# Patient Record
Sex: Female | Born: 1965 | Race: White | Hispanic: No | Marital: Married | Smoking: Never smoker
Health system: Southern US, Community
[De-identification: ages and names within clinical notes are randomized; demographics above are authoritative.]

## PROBLEM LIST (undated history)

## (undated) DIAGNOSIS — K219 Gastro-esophageal reflux disease without esophagitis: Secondary | ICD-10-CM

## (undated) HISTORY — PX: KNEE SURGERY: SHX244

## (undated) HISTORY — PX: VASCULAR SURGERY: SHX849

## (undated) HISTORY — DX: Gastro-esophageal reflux disease without esophagitis: K21.9

---

## 1998-08-31 ENCOUNTER — Other Ambulatory Visit: Admission: RE | Admit: 1998-08-31 | Discharge: 1998-08-31 | Payer: Self-pay | Admitting: Obstetrics and Gynecology

## 1999-10-19 ENCOUNTER — Other Ambulatory Visit: Admission: RE | Admit: 1999-10-19 | Discharge: 1999-10-19 | Payer: Self-pay | Admitting: Obstetrics and Gynecology

## 2001-03-05 ENCOUNTER — Ambulatory Visit (HOSPITAL_COMMUNITY): Admission: RE | Admit: 2001-03-05 | Discharge: 2001-03-05 | Payer: Self-pay | Admitting: Orthopedic Surgery

## 2001-03-05 ENCOUNTER — Encounter: Payer: Self-pay | Admitting: Orthopedic Surgery

## 2001-12-05 ENCOUNTER — Other Ambulatory Visit: Admission: RE | Admit: 2001-12-05 | Discharge: 2001-12-05 | Payer: Self-pay | Admitting: Obstetrics and Gynecology

## 2001-12-06 ENCOUNTER — Other Ambulatory Visit: Admission: RE | Admit: 2001-12-06 | Discharge: 2001-12-06 | Payer: Self-pay | Admitting: Dermatology

## 2001-12-18 ENCOUNTER — Encounter: Payer: Self-pay | Admitting: Emergency Medicine

## 2001-12-18 ENCOUNTER — Emergency Department (HOSPITAL_COMMUNITY): Admission: EM | Admit: 2001-12-18 | Discharge: 2001-12-18 | Payer: Self-pay | Admitting: Emergency Medicine

## 2003-01-27 ENCOUNTER — Other Ambulatory Visit: Admission: RE | Admit: 2003-01-27 | Discharge: 2003-01-27 | Payer: Self-pay | Admitting: Obstetrics & Gynecology

## 2004-04-16 ENCOUNTER — Encounter: Admission: RE | Admit: 2004-04-16 | Discharge: 2004-04-16 | Payer: Self-pay | Admitting: Obstetrics and Gynecology

## 2004-06-03 ENCOUNTER — Ambulatory Visit (HOSPITAL_COMMUNITY): Admission: RE | Admit: 2004-06-03 | Discharge: 2004-06-03 | Payer: Self-pay | Admitting: Internal Medicine

## 2005-03-14 ENCOUNTER — Ambulatory Visit (HOSPITAL_COMMUNITY): Admission: RE | Admit: 2005-03-14 | Discharge: 2005-03-14 | Payer: Self-pay | Admitting: Internal Medicine

## 2005-03-29 ENCOUNTER — Ambulatory Visit (HOSPITAL_COMMUNITY): Admission: RE | Admit: 2005-03-29 | Discharge: 2005-03-29 | Payer: Self-pay | Admitting: Internal Medicine

## 2008-01-23 ENCOUNTER — Encounter: Admission: RE | Admit: 2008-01-23 | Discharge: 2008-01-23 | Payer: Self-pay | Admitting: General Surgery

## 2008-08-18 ENCOUNTER — Encounter: Admission: RE | Admit: 2008-08-18 | Discharge: 2008-08-18 | Payer: Self-pay | Admitting: General Surgery

## 2008-09-25 ENCOUNTER — Ambulatory Visit (HOSPITAL_COMMUNITY): Admission: RE | Admit: 2008-09-25 | Discharge: 2008-09-25 | Payer: Self-pay | Admitting: Internal Medicine

## 2009-01-20 ENCOUNTER — Encounter: Admission: RE | Admit: 2009-01-20 | Discharge: 2009-01-20 | Payer: Self-pay | Admitting: General Surgery

## 2009-10-15 ENCOUNTER — Ambulatory Visit (HOSPITAL_BASED_OUTPATIENT_CLINIC_OR_DEPARTMENT_OTHER): Admission: RE | Admit: 2009-10-15 | Discharge: 2009-10-15 | Payer: Self-pay | Admitting: Orthopedic Surgery

## 2011-07-18 ENCOUNTER — Inpatient Hospital Stay (HOSPITAL_COMMUNITY): Payer: BC Managed Care – PPO | Admitting: Anesthesiology

## 2011-07-18 ENCOUNTER — Encounter (HOSPITAL_COMMUNITY)
Admission: EM | Disposition: A | Discharge status: Home / Self Care | Payer: Self-pay | Source: Home / Self Care | Attending: General Surgery

## 2011-07-18 ENCOUNTER — Observation Stay (HOSPITAL_COMMUNITY)
Admission: EM | Admit: 2011-07-18 | Discharge: 2011-07-19 | Disposition: A | Discharge status: Home / Self Care | Payer: BC Managed Care – PPO | Attending: General Surgery | Admitting: General Surgery

## 2011-07-18 ENCOUNTER — Other Ambulatory Visit: Payer: Self-pay | Admitting: General Surgery

## 2011-07-18 ENCOUNTER — Encounter (HOSPITAL_COMMUNITY): Payer: Self-pay | Admitting: Anesthesiology

## 2011-07-18 ENCOUNTER — Emergency Department (HOSPITAL_COMMUNITY): Payer: BC Managed Care – PPO

## 2011-07-18 ENCOUNTER — Encounter (HOSPITAL_COMMUNITY): Payer: Self-pay

## 2011-07-18 DIAGNOSIS — K3580 Unspecified acute appendicitis: Principal | ICD-10-CM | POA: Insufficient documentation

## 2011-07-18 DIAGNOSIS — R1031 Right lower quadrant pain: Secondary | ICD-10-CM | POA: Insufficient documentation

## 2011-07-18 DIAGNOSIS — R110 Nausea: Secondary | ICD-10-CM | POA: Insufficient documentation

## 2011-07-18 DIAGNOSIS — R509 Fever, unspecified: Secondary | ICD-10-CM | POA: Insufficient documentation

## 2011-07-18 HISTORY — PX: LAPAROSCOPIC APPENDECTOMY: SHX408

## 2011-07-18 LAB — URINE MICROSCOPIC-ADD ON

## 2011-07-18 LAB — URINALYSIS, ROUTINE W REFLEX MICROSCOPIC
Leukocytes, UA: NEGATIVE
Nitrite: NEGATIVE
Protein, ur: 30 mg/dL — AB
Specific Gravity, Urine: 1.02 (ref 1.005–1.030)
Urobilinogen, UA: 0.2 mg/dL (ref 0.0–1.0)

## 2011-07-18 LAB — DIFFERENTIAL
Basophils Absolute: 0 10*3/uL (ref 0.0–0.1)
Eosinophils Absolute: 0 10*3/uL (ref 0.0–0.7)
Eosinophils Relative: 0 % (ref 0–5)
Neutrophils Relative %: 82 % — ABNORMAL HIGH (ref 43–77)

## 2011-07-18 LAB — BASIC METABOLIC PANEL
Calcium: 9.9 mg/dL (ref 8.4–10.5)
GFR calc Af Amer: 80 mL/min — ABNORMAL LOW (ref >90)
GFR calc non Af Amer: 69 mL/min — ABNORMAL LOW (ref >90)
Potassium: 3.9 mEq/L (ref 3.5–5.1)
Sodium: 137 mEq/L (ref 135–145)

## 2011-07-18 LAB — LIPASE, BLOOD: Lipase: 17 U/L (ref 11–59)

## 2011-07-18 LAB — CBC
MCH: 29.5 pg (ref 26.0–34.0)
MCV: 89.7 fL (ref 78.0–100.0)
Platelets: 236 10*3/uL (ref 150–400)
RBC: 4.75 MIL/uL (ref 3.87–5.11)
RDW: 12.7 % (ref 11.5–15.5)
WBC: 19.5 10*3/uL — ABNORMAL HIGH (ref 4.0–10.5)

## 2011-07-18 LAB — PREGNANCY, URINE: Preg Test, Ur: NEGATIVE

## 2011-07-18 SURGERY — APPENDECTOMY, LAPAROSCOPIC
Anesthesia: General | Site: Abdomen | Wound class: Contaminated

## 2011-07-18 MED ORDER — PROPOFOL 10 MG/ML IV EMUL
INTRAVENOUS | Status: DC | PRN
  Administered 2011-07-18: 50 mg via INTRAVENOUS
  Administered 2011-07-18: 150 mg via INTRAVENOUS

## 2011-07-18 MED ORDER — HYDROCODONE-ACETAMINOPHEN 5-325 MG PO TABS: 1.0000 | ORAL_TABLET | ORAL | Status: DC | PRN

## 2011-07-18 MED ORDER — SODIUM CHLORIDE 0.9 % IV SOLN
1.0000 g | Freq: Once | INTRAVENOUS | Status: AC
  Administered 2011-07-18: 1 g via INTRAVENOUS
  Filled 2011-07-18: qty 1

## 2011-07-18 MED ORDER — SUCCINYLCHOLINE CHLORIDE 20 MG/ML IJ SOLN
INTRAMUSCULAR | Status: DC | PRN
  Administered 2011-07-18: 100 mg via INTRAVENOUS

## 2011-07-18 MED ORDER — ENOXAPARIN SODIUM 40 MG/0.4ML ~~LOC~~ SOLN: 40.0000 mg | Freq: Once | SUBCUTANEOUS | Status: DC

## 2011-07-18 MED ORDER — LIDOCAINE HCL (CARDIAC) 10 MG/ML IV SOLN
INTRAVENOUS | Status: DC | PRN
  Administered 2011-07-18: 50 mg via INTRAVENOUS

## 2011-07-18 MED ORDER — LACTATED RINGERS IV SOLN
INTRAVENOUS | Status: DC | PRN
  Administered 2011-07-18: 18:00:00 via INTRAVENOUS

## 2011-07-18 MED ORDER — ENOXAPARIN SODIUM 40 MG/0.4ML ~~LOC~~ SOLN
40.0000 mg | SUBCUTANEOUS | Status: DC
  Administered 2011-07-18: 40 mg via SUBCUTANEOUS
  Filled 2011-07-18: qty 0.4

## 2011-07-18 MED ORDER — ROCURONIUM BROMIDE 100 MG/10ML IV SOLN
INTRAVENOUS | Status: DC | PRN
  Administered 2011-07-18: 10 mg via INTRAVENOUS

## 2011-07-18 MED ORDER — FENTANYL CITRATE 0.05 MG/ML IJ SOLN
INTRAMUSCULAR | Status: DC | PRN
  Administered 2011-07-18: 100 ug via INTRAVENOUS
  Administered 2011-07-18 (×4): 50 ug via INTRAVENOUS

## 2011-07-18 MED ORDER — LACTATED RINGERS IV SOLN: INTRAVENOUS | Status: DC

## 2011-07-18 MED ORDER — ACETAMINOPHEN 325 MG PO TABS
650.0000 mg | ORAL_TABLET | Freq: Four times a day (QID) | ORAL | Status: DC | PRN
  Administered 2011-07-19 (×3): 650 mg via ORAL
  Filled 2011-07-18 (×3): qty 2

## 2011-07-18 MED ORDER — HYDROMORPHONE HCL PF 1 MG/ML IJ SOLN
1.0000 mg | INTRAMUSCULAR | Status: DC | PRN
  Administered 2011-07-18: 1 mg via INTRAVENOUS

## 2011-07-18 MED ORDER — ONDANSETRON HCL 4 MG/2ML IJ SOLN: 4.0000 mg | Freq: Four times a day (QID) | INTRAMUSCULAR | Status: DC | PRN

## 2011-07-18 MED ORDER — ONDANSETRON HCL 4 MG/2ML IJ SOLN
INTRAMUSCULAR | Status: DC | PRN
  Administered 2011-07-18: 4 mg via INTRAVENOUS

## 2011-07-18 MED ORDER — GLYCOPYRROLATE 0.2 MG/ML IJ SOLN
INTRAMUSCULAR | Status: DC | PRN
  Administered 2011-07-18: .6 mg via INTRAVENOUS

## 2011-07-18 MED ORDER — IOHEXOL 300 MG/ML  SOLN
100.0000 mL | Freq: Once | INTRAMUSCULAR | Status: AC | PRN
  Administered 2011-07-18: 100 mL via INTRAVENOUS

## 2011-07-18 MED ORDER — MIDAZOLAM HCL 5 MG/5ML IJ SOLN
INTRAMUSCULAR | Status: DC | PRN
  Administered 2011-07-18: 2 mg via INTRAVENOUS

## 2011-07-18 MED ORDER — NEOSTIGMINE METHYLSULFATE 1 MG/ML IJ SOLN
INTRAMUSCULAR | Status: DC | PRN
  Administered 2011-07-18: 4 mg via INTRAVENOUS

## 2011-07-18 MED ORDER — SODIUM CHLORIDE 0.9 % IR SOLN
Status: DC | PRN
  Administered 2011-07-18: 1000 mL

## 2011-07-18 MED ORDER — BUPIVACAINE HCL 0.5 % IJ SOLN
INTRAMUSCULAR | Status: DC | PRN
  Administered 2011-07-18: 10 mL

## 2011-07-18 MED ORDER — SODIUM CHLORIDE 0.9 % IV SOLN
INTRAVENOUS | Status: DC | PRN
  Administered 2011-07-18: 17:00:00 via INTRAVENOUS

## 2011-07-18 MED ORDER — PANTOPRAZOLE SODIUM 40 MG IV SOLR
40.0000 mg | Freq: Every day | INTRAVENOUS | Status: DC
  Administered 2011-07-18: 40 mg via INTRAVENOUS
  Filled 2011-07-18: qty 40

## 2011-07-18 SURGICAL SUPPLY — 46 items
APL SKNCLS STERI-STRIP NONHPOA (GAUZE/BANDAGES/DRESSINGS) ×1
BAG HAMPER (MISCELLANEOUS) ×2 IMPLANT
BAG SPEC RTRVL LRG 6X4 10 (ENDOMECHANICALS) ×1
BENZOIN TINCTURE PRP APPL 2/3 (GAUZE/BANDAGES/DRESSINGS) ×2 IMPLANT
CLOTH BEACON ORANGE TIMEOUT ST (SAFETY) ×2 IMPLANT
COVER LIGHT HANDLE STERIS (MISCELLANEOUS) ×4 IMPLANT
CUTTER ENDO LINEAR 45M (STAPLE) ×2 IMPLANT
DECANTER SPIKE VIAL GLASS SM (MISCELLANEOUS) ×2 IMPLANT
DEVICE TROCAR PUNCTURE CLOSURE (ENDOMECHANICALS) ×2 IMPLANT
DISSECTOR BLUNT TIP ENDO 5MM (MISCELLANEOUS) IMPLANT
DURAPREP 26ML APPLICATOR (WOUND CARE) ×2 IMPLANT
ELECT REM PT RETURN 9FT ADLT (ELECTROSURGICAL) ×2
ELECTRODE REM PT RTRN 9FT ADLT (ELECTROSURGICAL) ×1 IMPLANT
FILTER SMOKE EVAC LAPAROSHD (FILTER) ×2 IMPLANT
FORMALIN 10 PREFIL 120ML (MISCELLANEOUS) ×2 IMPLANT
GLOVE BIOGEL PI IND STRL 7.5 (GLOVE) ×1 IMPLANT
GLOVE BIOGEL PI INDICATOR 7.5 (GLOVE) ×1
GLOVE ECLIPSE 6.5 STRL STRAW (GLOVE) ×2 IMPLANT
GLOVE ECLIPSE 7.0 STRL STRAW (GLOVE) ×2 IMPLANT
GLOVE EXAM NITRILE MD LF STRL (GLOVE) ×1 IMPLANT
GLOVE INDICATOR 7.0 STRL GRN (GLOVE) ×2 IMPLANT
GOWN STRL REIN XL XLG (GOWN DISPOSABLE) ×5 IMPLANT
INST SET LAPROSCOPIC AP (KITS) ×2 IMPLANT
KIT ROOM TURNOVER APOR (KITS) ×2 IMPLANT
LIGASURE 5MM LAPAROSCOPIC (INSTRUMENTS) ×2 IMPLANT
MANIFOLD NEPTUNE II (INSTRUMENTS) ×2 IMPLANT
NDL INSUFFLATION 14GA 120MM (NEEDLE) ×1 IMPLANT
NEEDLE INSUFFLATION 14GA 120MM (NEEDLE) ×2 IMPLANT
NS IRRIG 1000ML POUR BTL (IV SOLUTION) ×2 IMPLANT
PACK LAP CHOLE LZT030E (CUSTOM PROCEDURE TRAY) ×2 IMPLANT
PAD ARMBOARD 7.5X6 YLW CONV (MISCELLANEOUS) ×2 IMPLANT
POUCH SPECIMEN RETRIEVAL 10MM (ENDOMECHANICALS) ×2 IMPLANT
RELOAD 45 VASCULAR/THIN (ENDOMECHANICALS) ×2 IMPLANT
RELOAD STAPLE 45 3.5 BLU ETS (ENDOMECHANICALS) IMPLANT
RELOAD STAPLE TA45 3.5 REG BLU (ENDOMECHANICALS) IMPLANT
SET BASIN LINEN APH (SET/KITS/TRAYS/PACK) ×2 IMPLANT
SET TUBE IRRIG SUCTION NO TIP (IRRIGATION / IRRIGATOR) IMPLANT
SLEEVE Z-THREAD 5X100MM (TROCAR) ×2 IMPLANT
STRIP CLOSURE SKIN 1/2X4 (GAUZE/BANDAGES/DRESSINGS) ×2 IMPLANT
SUT MNCRL AB 4-0 PS2 18 (SUTURE) ×2 IMPLANT
SUT VIC AB 2-0 CT2 27 (SUTURE) ×2 IMPLANT
TRAY FOLEY CATH 14FR (SET/KITS/TRAYS/PACK) ×2 IMPLANT
TROCAR Z-THRD FIOS HNDL 11X100 (TROCAR) ×2 IMPLANT
TROCAR Z-THREAD FIOS 5X100MM (TROCAR) ×2 IMPLANT
TROCAR Z-THREAD SLEEVE 11X100 (TROCAR) ×2 IMPLANT
WARMER LAPAROSCOPE (MISCELLANEOUS) ×2 IMPLANT

## 2011-07-18 NOTE — ED Notes (Signed)
Severe ab pain that started on sat. Night, denies any n/v/d, low grade fever. Sent by pmd for eval

## 2011-07-18 NOTE — Op Note (Signed)
Patient:  Jaime George  DOB:  1965/07/11  MRN:  [id number]   Preop Diagnosis:  Acute appendicitis  Postop Diagnosis:  Same  Procedure:  Laparoscopic appendectomy  Surgeon:  Dr. Tilford Pillar  Anes:  General endotracheal, 0.5% Sensorcaine plain for local  Indications:  Patient is a 46 year old female presented to [facility] emergency department right lower quadrant abdominal pain for approximately 2 days. Workup and evaluation was consistent for acute appendicitis. This benefits alternatives of a laparoscopic possible open emergent appendectomy were discussed at length with the patient and family including but not limited to risk of bleeding, infection, appendiceal stump leak, intraoperative cardiac and pulmonary events. Patient's questions and concerns were addressed the patient was consented for the planned procedure.  Procedure note:  Patient is taken in the OR space the supine position on the OR table time the general anesthetic is a Optician, dispensing. Once patient was asleep she was endotracheally intubated by the nurse anesthetist. At this point a Foley catheter is placed in standard sterile fashion by the operative staff. Her abdomen is prepped with DuraPrep solution and draped in standard fashion. A stab incision was created supraumbilically with 11 blade scalpel. Additional dissection down to subcuticular tissues carried using a Coker clamp was utilized to grasp the anterior abdominal fascia and lift his anteriorly. A Veress needle was inserted saline drop test is utilized confirm intraperitoneal placement and then pneumoperitoneum was initiated once sufficient pneumoperitoneum was obtained an 11 mm was inserted over a laparoscope allowing visualization the trocar entering into the peritoneum. At this point the inner cannulas removed the laparoscope was reinserted there is no is a trocar peritoneal placement injury. At this time the remaining trochars were placed the 5 mm trocar in the suprapubic  region and a 11 mm trocar in the left lateral abdominal wall. These are placed under direct visualization. At this point patient's placed into a Trendelenburg left lateral decubitus position. The tiny of the cecum are followed down to the base of the appendix. The omentum is peeled off of the acutely inflamed appendix. A window was created between the appendix and mesoappendix with a regular grasper. The mesoappendix is then divided using the 5 mm LigaSure bipolar device. The base the appendix is divided using a Endo GIA 45 vascular stapler load. At this point there appendix is placed into an Endo Catch bag and placed in the right upper quadrant. At this point did inspect the mesoappendix the staple line. Both. Intact with no is any bleeding. As quite pleased with the appearance. I did pull the omentum back over this area. Turned attention at this time to closure.  Using an Endo Close suture passing device a 2-0 Vicryl suture was passed through both 11 mm car sites. With the sutures in place the appendix was retrieved was removed through the umbilical trocar site and intact Endo Catch bag. It was placed in the back table and sent as a permanent specimen to pathology. At this time the pneumoperitoneum was evacuated. The trochars were removed. The Vicryl sutures were secured. Local anesthetic is instilled. A 4-0 Monocryl utilized reapproximate the skin edges at all 4 trocar sites. The skin was washed dried moist dry towel. Benzoin is applied around the incisions. Half-inch Steri-Strips are placed. The drapes removed the patient left come out of general static is transferred to PACU in stable condition. At the conclusion of procedure all instrument, sponge, needle counts are correct. The patient tolerated procedure extremely well.  Complications:  None apparent  EBL:  Minimal  Specimen:  Appendix

## 2011-07-18 NOTE — Preoperative (Signed)
Beta Blockers   Reason not to administer Beta Blockers:Not Applicable 

## 2011-07-18 NOTE — ED Provider Notes (Signed)
This chart was scribed for Carleene Cooper III, MD by Williemae Natter. The patient was seen in room APA06/[facility]06 at 2:04 PM.  CSN: [id number]  Arrival date & time 07/18/11  1056   First MD Initiated Contact with Patient 07/18/11 1351      Chief Complaint  Patient presents with  . Abdominal Pain    (Consider location/radiation/quality/duration/timing/severity/associated sxs/prior treatment) HPI Jaime George is a 46 y.o. female who presents to the Emergency Department complaining of moderate acute onset lower abdominal pain that radiates to lower back since Saturday night. The pain has been constant. Pt denies any vomiting or dysuria. Pt's reported having a decreased appetite.Pt has a history of knee surgery.  History reviewed. No pertinent past medical history.  Past Surgical History  Procedure Date  . Knee surgery     No family history on file.  History  Substance Use Topics  . Smoking status: Never Smoker   . Smokeless tobacco: Not on file  . Alcohol Use: No    OB History    Grav Para Term Preterm Abortions TAB SAB Ect Mult Living                  Review of Systems 10 Systems reviewed and are negative for acute change except as noted in the HPI.  Allergies  Review of patient's allergies indicates not on file.  Home Medications  No current outpatient prescriptions on file.  BP 125/78  Pulse 95  Temp(Src) 98.2 F (36.8 C) (Oral)  Resp 18  Ht 5\' 7"  (1.702 m)  Wt 190 lb (86.183 kg)  BMI 29.76 kg/m2  SpO2 100%  LMP 07/04/2011  Physical Exam  Nursing note and vitals reviewed. Constitutional: She is oriented to person, place, and time. She appears well-developed and well-nourished.  HENT:  Head: Normocephalic and atraumatic.  Eyes: Conjunctivae are normal. Pupils are equal, round, and reactive to light.  Neck: Normal range of motion. Neck supple.  Cardiovascular: Normal rate, regular rhythm and normal heart sounds.   Pulmonary/Chest: Effort normal and breath  sounds normal.  Abdominal: Soft.  Musculoskeletal: Normal range of motion.  Neurological: She is alert and oriented to person, place, and time.  Skin: Skin is warm and dry.  Psychiatric: She has a normal mood and affect. Her behavior is normal.    ED Course  Procedures (including critical care time) DIAGNOSTIC STUDIES: Oxygen Saturation is 100% on room air, normal by my interpretation.    COORDINATION OF CARE:  Medications  iohexol (OMNIPAQUE) 300 MG/ML solution 100 mL (100 mL Intravenous Contrast Given 07/18/11 1331)      Labs Reviewed  CBC - Abnormal; Notable for the following:    WBC 19.5 (*)    All other components within normal limits  DIFFERENTIAL - Abnormal; Notable for the following:    Neutrophils Relative 82 (*)    Neutro Abs 15.9 (*)    Lymphocytes Relative 10 (*)    Monocytes Absolute 1.6 (*)    All other components within normal limits  BASIC METABOLIC PANEL - Abnormal; Notable for the following:    Glucose, Bld 108 (*)    GFR calc non Af Amer 69 (*)    GFR calc Af Amer 80 (*)    All other components within normal limits  URINALYSIS, ROUTINE W REFLEX MICROSCOPIC - Abnormal; Notable for the following:    Hgb urine dipstick MODERATE (*)    Bilirubin Urine SMALL (*)    Ketones, ur 15 (*)  Protein, ur 30 (*)    All other components within normal limits  URINE MICROSCOPIC-ADD ON - Abnormal; Notable for the following:    Squamous Epithelial / LPF FEW (*)    Bacteria, UA FEW (*)    All other components within normal limits  PREGNANCY, URINE  LIPASE, BLOOD   Ct Abdomen Pelvis W Contrast  07/18/2011  *RADIOLOGY REPORT*  Clinical Data: Severe low abdominal pain for 1 day.  CT ABDOMEN AND PELVIS WITH CONTRAST  Technique:  Multidetector CT imaging of the abdomen and pelvis was performed following the standard protocol during bolus administration of intravenous contrast.  Contrast: OMNIPAQUE IOHEXOL 300 MG/ML IV SOLN  Comparison: Abdominal pelvic CT 04/16/2004.   Findings: The lung bases are clear and there is no pleural effusion.  The liver, gallbladder, biliary system and pancreas appear normal.  The spleen, adrenal glands and kidneys appear normal.  There are inflammatory changes in the right lower quadrant.  The appendix is distended to 12 mm and contains an appendicolith. There is a small amount of adjacent extraluminal fluid as well as a small amount of free pelvic fluid.  No drainable pelvic fluid collection is identified.  Mild wall thickening of the terminal ileum and cecum is likely secondary to presumed acute appendicitis. There is no evidence of bowel obstruction.  The uterus and bladder appear unremarkable.  There are low density adnexal lesions bilaterally consistent with normal follicles.  IMPRESSION:  1.  Acute appendicitis with periappendiceal inflammatory change and a small amount of ill-defined fluid.  Early rupture cannot be excluded. 2.  Probable secondary wall thickening of the terminal ileum and cecum. 3.  No evidence of bowel obstruction. 4.  The solid visceral organs appear normal.  These results were called by telephone on 07/18/2011  at  1350 hours to  Dr. Ignacia Palma, who verbally acknowledged these results.  Original Report Authenticated By: Gerrianne Scale, M.D.   Results for orders placed during the hospital encounter of 07/18/11  CBC      Component Value Range   WBC 19.5 (*) 4.0 - 10.5 (K/uL)   RBC 4.75  3.87 - 5.11 (MIL/uL)   Hemoglobin 14.0  12.0 - 15.0 (g/dL)   HCT 16.1  09.6 - 04.5 (%)   MCV 89.7  78.0 - 100.0 (fL)   MCH 29.5  26.0 - 34.0 (pg)   MCHC 32.9  30.0 - 36.0 (g/dL)   RDW 40.9  81.1 - 91.4 (%)   Platelets 236  150 - 400 (K/uL)  DIFFERENTIAL      Component Value Range   Neutrophils Relative 82 (*) 43 - 77 (%)   Neutro Abs 15.9 (*) 1.7 - 7.7 (K/uL)   Lymphocytes Relative 10 (*) 12 - 46 (%)   Lymphs Abs 2.0  0.7 - 4.0 (K/uL)   Monocytes Relative 8  3 - 12 (%)   Monocytes Absolute 1.6 (*) 0.1 - 1.0 (K/uL)    Eosinophils Relative 0  0 - 5 (%)   Eosinophils Absolute 0.0  0.0 - 0.7 (K/uL)   Basophils Relative 0  0 - 1 (%)   Basophils Absolute 0.0  0.0 - 0.1 (K/uL)  BASIC METABOLIC PANEL      Component Value Range   Sodium 137  135 - 145 (mEq/L)   Potassium 3.9  3.5 - 5.1 (mEq/L)   Chloride 101  96 - 112 (mEq/L)   CO2 25  19 - 32 (mEq/L)   Glucose, Bld 108 (*) 70 - 99 (  mg/dL)   BUN 10  6 - 23 (mg/dL)   Creatinine, Ser 7.82  0.50 - 1.10 (mg/dL)   Calcium 9.9  8.4 - 95.6 (mg/dL)   GFR calc non Af Amer 69 (*) >90 (mL/min)   GFR calc Af Amer 80 (*) >90 (mL/min)  URINALYSIS, ROUTINE W REFLEX MICROSCOPIC      Component Value Range   Color, Urine YELLOW  YELLOW    APPearance CLEAR  CLEAR    Specific Gravity, Urine 1.020  1.005 - 1.030    pH 6.0  5.0 - 8.0    Glucose, UA NEGATIVE  NEGATIVE (mg/dL)   Hgb urine dipstick MODERATE (*) NEGATIVE    Bilirubin Urine SMALL (*) NEGATIVE    Ketones, ur 15 (*) NEGATIVE (mg/dL)   Protein, ur 30 (*) NEGATIVE (mg/dL)   Urobilinogen, UA 0.2  0.0 - 1.0 (mg/dL)   Nitrite NEGATIVE  NEGATIVE    Leukocytes, UA NEGATIVE  NEGATIVE   PREGNANCY, URINE      Component Value Range   Preg Test, Ur NEGATIVE  NEGATIVE   LIPASE, BLOOD      Component Value Range   Lipase 17  11 - 59 (U/L)  URINE MICROSCOPIC-ADD ON      Component Value Range   Squamous Epithelial / LPF FEW (*) RARE    RBC / HPF 11-20  <3 (RBC/hpf)   Bacteria, UA FEW (*) RARE    Ct Abdomen Pelvis W Contrast  07/18/2011  *RADIOLOGY REPORT*  Clinical Data: Severe low abdominal pain for 1 day.  CT ABDOMEN AND PELVIS WITH CONTRAST  Technique:  Multidetector CT imaging of the abdomen and pelvis was performed following the standard protocol during bolus administration of intravenous contrast.  Contrast: OMNIPAQUE IOHEXOL 300 MG/ML IV SOLN  Comparison: Abdominal pelvic CT 04/16/2004.  Findings: The lung bases are clear and there is no pleural effusion.  The liver, gallbladder, biliary system and pancreas  appear normal.  The spleen, adrenal glands and kidneys appear normal.  There are inflammatory changes in the right lower quadrant.  The appendix is distended to 12 mm and contains an appendicolith. There is a small amount of adjacent extraluminal fluid as well as a small amount of free pelvic fluid.  No drainable pelvic fluid collection is identified.  Mild wall thickening of the terminal ileum and cecum is likely secondary to presumed acute appendicitis. There is no evidence of bowel obstruction.  The uterus and bladder appear unremarkable.  There are low density adnexal lesions bilaterally consistent with normal follicles.  IMPRESSION:  1.  Acute appendicitis with periappendiceal inflammatory change and a small amount of ill-defined fluid.  Early rupture cannot be excluded. 2.  Probable secondary wall thickening of the terminal ileum and cecum. 3.  No evidence of bowel obstruction. 4.  The solid visceral organs appear normal.  These results were called by telephone on 07/18/2011  at  1350 hours to  Dr. Ignacia Palma, who verbally acknowledged these results.  Original Report Authenticated By: Gerrianne Scale, M.D.    Pt seen --> physical exam performed.  Lab workup reviewed, and CT of abdomen and pelvis reviewed --> Dx acute appendicitis.  Case discussed with Dr. Leticia Penna --> pt to be treated with Pincus Sanes, held NPO, and he will see pt.   1. Acute appendicitis       I personally performed the services described in this documentation, which was scribed in my presence. The recorded information has been reviewed and considered.  Carleene Cooper  III, M.D.    Carleene Cooper III, MD 07/18/11 1450

## 2011-07-18 NOTE — Anesthesia Procedure Notes (Addendum)
Performed by: Corena Pilgrim   Procedure Name: Intubation Date/Time: 07/18/2011 5:11 PM Performed by: Carolyne Littles, Isacc Turney Pre-anesthesia Checklist: Patient identified, Patient being monitored, Emergency Drugs available, Timeout performed and Suction available Patient Re-evaluated:Patient Re-evaluated prior to inductionOxygen Delivery Method: Circle System Utilized Preoxygenation: Pre-oxygenation with 100% oxygen Intubation Type: IV induction, Rapid sequence and Cricoid Pressure applied Laryngoscope Size: Miller and 3 Grade View: Grade I Tube type: Oral Tube size: 7.0 mm Number of attempts: 1 Placement Confirmation: ETT inserted through vocal cords under direct vision,  positive ETCO2 and breath sounds checked- equal and bilateral Secured at: 22 cm Tube secured with: Tape Dental Injury: Teeth and Oropharynx as per pre-operative assessment

## 2011-07-18 NOTE — Progress Notes (Signed)
Awake. C/O pain in shoulders and chest. Vital signs stable. Rates pain 6. Med as noted.

## 2011-07-18 NOTE — Anesthesia Postprocedure Evaluation (Signed)
  Anesthesia Post-op Note  Patient: Jaime George  Procedure(s) Performed:  APPENDECTOMY LAPAROSCOPIC  Patient Location: PACU  Anesthesia Type: General  Level of Consciousness: awake, alert  and oriented  Airway and Oxygen Therapy: Patient Spontanous Breathing and Patient connected to face mask oxygen  Post-op Pain: none  Post-op Assessment: Post-op Vital signs reviewed, Patient's Cardiovascular Status Stable, Respiratory Function Stable, Patent Airway, No signs of Nausea or vomiting and Pain level controlled  Post-op Vital Signs: Reviewed and stable  Complications: No apparent anesthesia complications

## 2011-07-18 NOTE — Transfer of Care (Signed)
Immediate Anesthesia Transfer of Care Note  Patient: Jaime George  Procedure(s) Performed:  APPENDECTOMY LAPAROSCOPIC  Patient Location: PACU  Anesthesia Type: General  Level of Consciousness: awake, alert , oriented and patient cooperative  Airway & Oxygen Therapy: Patient Spontanous Breathing and Patient connected to face mask oxygen  Post-op Assessment: Report given to PACU RN and Post -op Vital signs reviewed and stable  Post vital signs: Reviewed and stable  Complications: No apparent anesthesia complications

## 2011-07-18 NOTE — H&P (Signed)
Jaime George is an 46 y.o. female.   Chief Complaint: Abdominal pain (right lower quadrant) HPI: Patient presented to [facility] emergency department with several day history of right lower quadrant abdominal pain. He said approximately 2 days ago in the right lower quadrant. It has persisted in this area. No radiation. Pain is worse with movement and some palpation. Her appetite has been poor although she has forced herself to eat some fruit over the last couple days. She has had associated nausea since Saturday. No emesis. Her last bowel movement was also Saturday 2 days ago with no evidence of melena or hematochezia. She has not had similar symptomatology in the past she has had subjective fevers and chills since that date. No change in urination. No sick contacts. No unusual travel. No unusual food exposures.  History reviewed. No pertinent past medical history.  Past Surgical History  Procedure Date  . Knee surgery     No family history on file. Social History:  reports that she has never smoked. She does not have any smokeless tobacco history on file. She reports that she does not drink alcohol or use illicit drugs.  Allergies:  Allergies  Allergen Reactions  . Codeine Nausea And Vomiting    Medications Prior to Admission  Medication Dose Route Frequency Provider Last Rate Last Dose  . ertapenem (INVANZ) 1 g in sodium chloride 0.9 % 50 mL IVPB  1 g Intravenous Once Carleene Cooper III, MD 100 mL/hr at 07/18/11 1521 1 g at 07/18/11 1521  . iohexol (OMNIPAQUE) 300 MG/ML solution 100 mL  100 mL Intravenous Once PRN Carleene Cooper III, MD   100 mL at 07/18/11 1331   No current outpatient prescriptions on file as of 07/18/2011.    Results for orders placed during the hospital encounter of 07/18/11 (from the past 48 hour(s))  URINALYSIS, ROUTINE W REFLEX MICROSCOPIC     Status: Abnormal   Collection Time   07/18/11 11:53 AM      Component Value Range Comment   Color, Urine YELLOW   YELLOW     APPearance CLEAR  CLEAR     Specific Gravity, Urine 1.020  1.005 - 1.030     pH 6.0  5.0 - 8.0     Glucose, UA NEGATIVE  NEGATIVE (mg/dL)    Hgb urine dipstick MODERATE (*) NEGATIVE     Bilirubin Urine SMALL (*) NEGATIVE     Ketones, ur 15 (*) NEGATIVE (mg/dL)    Protein, ur 30 (*) NEGATIVE (mg/dL)    Urobilinogen, UA 0.2  0.0 - 1.0 (mg/dL)    Nitrite NEGATIVE  NEGATIVE     Leukocytes, UA NEGATIVE  NEGATIVE    PREGNANCY, URINE     Status: Normal   Collection Time   07/18/11 11:53 AM      Component Value Range Comment   Preg Test, Ur NEGATIVE  NEGATIVE    URINE MICROSCOPIC-ADD ON     Status: Abnormal   Collection Time   07/18/11 11:53 AM      Component Value Range Comment   Squamous Epithelial / LPF FEW (*) RARE     RBC / HPF 11-20  <3 (RBC/hpf)    Bacteria, UA FEW (*) RARE    CBC     Status: Abnormal   Collection Time   07/18/11 12:21 PM      Component Value Range Comment   WBC 19.5 (*) 4.0 - 10.5 (K/uL)    RBC 4.75  3.87 - 5.11 (  MIL/uL)    Hemoglobin 14.0  12.0 - 15.0 (g/dL)    HCT 29.5  62.1 - 30.8 (%)    MCV 89.7  78.0 - 100.0 (fL)    MCH 29.5  26.0 - 34.0 (pg)    MCHC 32.9  30.0 - 36.0 (g/dL)    RDW 65.7  84.6 - 96.2 (%)    Platelets 236  150 - 400 (K/uL)   DIFFERENTIAL     Status: Abnormal   Collection Time   07/18/11 12:21 PM      Component Value Range Comment   Neutrophils Relative 82 (*) 43 - 77 (%)    Neutro Abs 15.9 (*) 1.7 - 7.7 (K/uL)    Lymphocytes Relative 10 (*) 12 - 46 (%)    Lymphs Abs 2.0  0.7 - 4.0 (K/uL)    Monocytes Relative 8  3 - 12 (%)    Monocytes Absolute 1.6 (*) 0.1 - 1.0 (K/uL)    Eosinophils Relative 0  0 - 5 (%)    Eosinophils Absolute 0.0  0.0 - 0.7 (K/uL)    Basophils Relative 0  0 - 1 (%)    Basophils Absolute 0.0  0.0 - 0.1 (K/uL)   BASIC METABOLIC PANEL     Status: Abnormal   Collection Time   07/18/11 12:21 PM      Component Value Range Comment   Sodium 137  135 - 145 (mEq/L)    Potassium 3.9  3.5 - 5.1 (mEq/L)    Chloride  101  96 - 112 (mEq/L)    CO2 25  19 - 32 (mEq/L)    Glucose, Bld 108 (*) 70 - 99 (mg/dL)    BUN 10  6 - 23 (mg/dL)    Creatinine, Ser 9.52  0.50 - 1.10 (mg/dL)    Calcium 9.9  8.4 - 10.5 (mg/dL)    GFR calc non Af Amer 69 (*) >90 (mL/min)    GFR calc Af Amer 80 (*) >90 (mL/min)   LIPASE, BLOOD     Status: Normal   Collection Time   07/18/11 12:21 PM      Component Value Range Comment   Lipase 17  11 - 59 (U/L)    Ct Abdomen Pelvis W Contrast  07/18/2011  *RADIOLOGY REPORT*  Clinical Data: Severe low abdominal pain for 1 day.  CT ABDOMEN AND PELVIS WITH CONTRAST  Technique:  Multidetector CT imaging of the abdomen and pelvis was performed following the standard protocol during bolus administration of intravenous contrast.  Contrast: OMNIPAQUE IOHEXOL 300 MG/ML IV SOLN  Comparison: Abdominal pelvic CT 04/16/2004.  Findings: The lung bases are clear and there is no pleural effusion.  The liver, gallbladder, biliary system and pancreas appear normal.  The spleen, adrenal glands and kidneys appear normal.  There are inflammatory changes in the right lower quadrant.  The appendix is distended to 12 mm and contains an appendicolith. There is a small amount of adjacent extraluminal fluid as well as a small amount of free pelvic fluid.  No drainable pelvic fluid collection is identified.  Mild wall thickening of the terminal ileum and cecum is likely secondary to presumed acute appendicitis. There is no evidence of bowel obstruction.  The uterus and bladder appear unremarkable.  There are low density adnexal lesions bilaterally consistent with normal follicles.  IMPRESSION:  1.  Acute appendicitis with periappendiceal inflammatory change and a small amount of ill-defined fluid.  Early rupture cannot be excluded. 2.  Probable secondary wall thickening  of the terminal ileum and cecum. 3.  No evidence of bowel obstruction. 4.  The solid visceral organs appear normal.  These results were called by telephone on  07/18/2011  at  1350 hours to  Dr. Ignacia Palma, who verbally acknowledged these results.  Original Report Authenticated By: Gerrianne Scale, M.D.    Review of Systems  Constitutional: Positive for fever and chills. Negative for weight loss, malaise/fatigue and diaphoresis.  HENT: Negative.   Eyes: Negative.   Respiratory: Negative.   Cardiovascular: Negative.   Gastrointestinal: Positive for nausea and abdominal pain (right lower quadrant). Negative for vomiting, diarrhea, constipation, blood in stool and melena.  Musculoskeletal: Negative.   Skin: Negative.   Neurological: Negative.  Negative for weakness.  Endo/Heme/Allergies: Negative.   Psychiatric/Behavioral: Negative.     Blood pressure 120/68, pulse 98, temperature 98.2 F (36.8 C), temperature source Oral, resp. rate 21, height 5\' 7"  (1.702 m), weight 86.183 kg (190 lb), last menstrual period 07/04/2011, SpO2 97.00%. Physical Exam  Constitutional: She is oriented to person, place, and time. She appears well-developed and well-nourished. No distress.       Obese  HENT:  Head: Normocephalic and atraumatic.  Eyes: Conjunctivae and EOM are normal. Pupils are equal, round, and reactive to light.  Neck: Normal range of motion. Neck supple. No tracheal deviation present. No thyromegaly present.  Cardiovascular: Normal rate, regular rhythm and normal heart sounds.   Respiratory: Effort normal and breath sounds normal. No respiratory distress. She has no wheezes. She has no rales.  GI: Soft. She exhibits no distension and no mass. There is tenderness (right lower quadrant. Positive point tenderness at McBurney's point. Positive Rovsing's). There is rebound and guarding.  Musculoskeletal: Normal range of motion.  Lymphadenopathy:    She has no cervical adenopathy.  Neurological: She is alert and oriented to person, place, and time.  Skin: Skin is warm and dry.     Assessment/Plan Acute appendicitis. Evaluation and radiographic  studies were discussed with the patient. Risks benefits and alternatives of a laparoscopic possible open appendectomy were discussed at length with the patient including but not limited to risk of bleeding, infection, appendiceal stump leak, intraoperative cardiac and pulmonary events. Anticipated postoperative course was also discussed with the patient. At this point have a low suspicion of ruptured appendicitis although did discuss this is a potential that the patient and likely recovery should this be the case. At this point will plan to proceed with emergent appendectomy. Continue IV fluid hydration, n.p.o. status, IV antibiotics with Invanz, DVT prophylaxis.  Kathrine Rieves C 07/18/2011, 3:51 PM

## 2011-07-18 NOTE — Anesthesia Preprocedure Evaluation (Addendum)
Anesthesia Evaluation  Patient identified by MRN, date of birth, ID band Patient awake    Reviewed: Allergy & Precautions, H&P , NPO status , Patient's Chart, lab work & pertinent test results, reviewed documented beta blocker date and time   History of Anesthesia Complications Negative for: history of anesthetic complications  Airway Mallampati: I TM Distance: >3 FB Neck ROM: Full    Dental No notable dental hx. (+) Teeth Intact   Pulmonary neg pulmonary ROS,  clear to auscultation  Pulmonary exam normal       Cardiovascular neg cardio ROS Regular Normal    Neuro/Psych Negative Neurological ROS  Negative Psych ROS   GI/Hepatic Neg liver ROS, Patient received Oral Contrast Agents,Contrast 1:00pm   Endo/Other  Negative Endocrine ROS  Renal/GU negative Renal ROS     Musculoskeletal negative musculoskeletal ROS (+)   Abdominal (+) obese,   Peds  Hematology negative hematology ROS (+)   Anesthesia Other Findings   Reproductive/Obstetrics negative OB ROS                          Anesthesia Physical Anesthesia Plan  ASA: II  Anesthesia Plan: General   Post-op Pain Management:    Induction: Intravenous, Rapid sequence and Cricoid pressure planned  Airway Management Planned: Oral ETT  Additional Equipment:   Intra-op Plan:   Post-operative Plan: Extubation in OR  Informed Consent: I have reviewed the patients History and Physical, chart, labs and discussed the procedure including the risks, benefits and alternatives for the proposed anesthesia with the patient or authorized representative who has indicated his/her understanding and acceptance.   Dental advisory given  Plan Discussed with: Anesthesiologist  Anesthesia Plan Comments:         Anesthesia Quick Evaluation

## 2011-07-19 MED ORDER — HYDROCODONE-ACETAMINOPHEN 5-325 MG PO TABS: 1.0000 | ORAL_TABLET | ORAL | Status: AC | PRN

## 2011-07-19 NOTE — Anesthesia Postprocedure Evaluation (Signed)
Anesthesia Post Note  Patient: Jaime George  Procedure(s) Performed:  APPENDECTOMY LAPAROSCOPIC  Anesthesia type: General Patient location: 307  Post pain: Pain level controlled  Post assessment: Post-op Vital signs reviewed, Patient's Cardiovascular Status Stable, Respiratory Function Stable, Patent Airway, No signs of Nausea or vomiting and Pain level controlled  Last Vitals:  Filed Vitals:   07/19/11 0626  BP: 123/75  Pulse: 88  Temp: 37.1 C  Resp: 18    Post vital signs: Reviewed and stable  Level of consciousness: awake and alert   Complications: No apparent anesthesia complications

## 2011-07-19 NOTE — Progress Notes (Signed)
Discharge Summary: a/o.vss. Saline lock removed. Incision to abdomen clean dry and intact.  Discharge instructions given. Prescriptions given. Pt verbalized understanding of instructions. Pt left floor via wheelchair with nursing staff and family member.

## 2011-07-19 NOTE — Progress Notes (Signed)
UR Chart Review Completed  

## 2011-07-19 NOTE — Progress Notes (Signed)
Patient has done well tonight so far.  She has been voiding fine and mainly having slight discomfort with activity.  Complaining of some aching in her right shoulder, which I explained could be from the air instilled in her abdomen during the surgery.  For pain, she wanted plain Tylenol so a call was made to Dr. Leticia Penna and 650 mg of Tylenol was given.  Patient did well with jello and crackers tonight with no nausea so her diet was advanced to a regular diet for breakfast.

## 2011-07-19 NOTE — Plan of Care (Signed)
Problem: Phase I Progression Outcomes Goal: Voiding-avoid urinary catheter unless indicated Foley removed after surgery and patient voiding fine.

## 2011-07-19 NOTE — Addendum Note (Signed)
Addendum  created 07/19/11 1306 by Minerva Areola, CRNA   Modules edited:Notes Section

## 2011-07-19 NOTE — Discharge Summary (Signed)
Physician Discharge Summary  Patient ID: Jaime George MRN: [id number] DOB/AGE: August 28, 1965 46 y.o.  Admit date: 07/18/2011 Discharge date: 07/19/2011  Admission Diagnoses: Acute appendicitis   Discharge Diagnoses: The same Active Problems:  * No active hospital problems. *    Discharged Condition: stable  Hospital Course: Patient presented to [facility] emergency department with approximately 2 days of right lower quadrant abdominal pain. Workup and evaluation was consistent for acute appendicitis. Patient was taken to the operating room for emergent laparoscopic appendectomy. No evidence of perforation was discovered. She did tolerate the appendectomy extremely well. Spent a brief period in the PACU. Was transferred to a regular surgical floor for overnight observation. She's been advanced to a diet. She is tolerating a regular diet. Her pain is controlled on oral pain medicine. She denies any nausea vomiting. Plans are made for discharge.  Consults: None  Significant Diagnostic Studies: radiology: CT scan: Abdomen and pelvis  Treatments: surgery: Emergent laparoscopic appendectomy  Discharge Exam: Blood pressure 123/75, pulse 88, temperature 98.8 F (37.1 C), temperature source Oral, resp. rate 18, height 5\' 7"  (1.702 m), weight 90.7 kg (199 lb 15.3 oz), last menstrual period 07/04/2011, SpO2 97.00%. General appearance: alert and no distress Eyes: Pupils equal round reactive extraocular use are intact. Resp: clear to auscultation bilaterally Cardio: regular rate and rhythm, S1, S2 normal, no murmur, click, rub or gallop GI: Abdomen is soft obese, expected postoperative abdominal pain. Incisions are clean dry and intact with Steri-Strips present. No peritoneal signs elicited.  Disposition: Home or Self Care  Discharge Orders    Future Orders Please Complete By Expires   Diet - low sodium heart healthy      Increase activity slowly      Discharge instructions      Comments:   Increase activity as tolerated. May place ice pack for comfort.    Driving Restrictions      Comments:   No driving while on pain medications.    Lifting restrictions      Comments:   No lifting over 20lbs for 4-5 weeks post-op.    Discharge wound care:      Comments:   Clean surgical sites with soap and water.  May shower the morning after surgery unless instructed by Dr. Leticia Penna otherwise.  No soaking for 2-3 weeks.    If adhesive strips are in place, they may be removed in 1-2 weeks while in the shower.     Call MD for:  temperature >100.4      Call MD for:  persistant nausea and vomiting      Call MD for:  severe uncontrolled pain      Call MD for:  redness, tenderness, or signs of infection (pain, swelling, redness, odor or green/yellow discharge around incision site)      Call MD for:  difficulty breathing, headache or visual disturbances        Medication List  As of 07/19/2011  1:32 PM   TAKE these medications         amphetamine-dextroamphetamine 10 MG 24 hr capsule   Commonly known as: ADDERALL XR   Take 10 mg by mouth every morning.      aspirin-acetaminophen-caffeine 250-250-65 MG per tablet   Commonly known as: EXCEDRIN MIGRAINE   Take 1 tablet by mouth every 6 (six) hours as needed. For migraines      fish oil-omega-3 fatty acids 1000 MG capsule   Take 1 g by mouth daily.  HYDROcodone-acetaminophen 5-325 MG per tablet   Commonly known as: NORCO   Take 1-2 tablets by mouth every 4 (four) hours as needed.      ibuprofen 200 MG tablet   Commonly known as: ADVIL,MOTRIN   Take 600 mg by mouth every 6 (six) hours as needed. For pain      mulitivitamin with minerals Tabs   Take 1 tablet by mouth daily.           Follow-up Information    Follow up with Carylon Perches, MD in 2 weeks.      Follow up with Juanluis Guastella C, MD in 3 weeks.   Contact information:   [address] [place] [address] [phone]           Signed: Fabio Bering 07/19/2011, 1:32 PM

## 2011-07-21 ENCOUNTER — Encounter (HOSPITAL_COMMUNITY): Payer: Self-pay | Admitting: General Surgery

## 2012-12-27 ENCOUNTER — Other Ambulatory Visit: Payer: Self-pay | Admitting: Orthopaedic Surgery

## 2012-12-27 DIAGNOSIS — M25511 Pain in right shoulder: Secondary | ICD-10-CM

## 2012-12-30 ENCOUNTER — Ambulatory Visit
Admission: RE | Admit: 2012-12-30 | Discharge: 2012-12-30 | Disposition: A | Discharge status: Home / Self Care | Payer: BC Managed Care – PPO | Source: Ambulatory Visit | Attending: Orthopaedic Surgery | Admitting: Orthopaedic Surgery

## 2012-12-30 DIAGNOSIS — M25511 Pain in right shoulder: Secondary | ICD-10-CM

## 2013-01-25 ENCOUNTER — Ambulatory Visit (HOSPITAL_COMMUNITY)
Admission: RE | Admit: 2013-01-25 | Discharge: 2013-01-25 | Disposition: A | Discharge status: Home / Self Care | Payer: BC Managed Care – PPO | Source: Ambulatory Visit | Attending: Orthopaedic Surgery | Admitting: Orthopaedic Surgery

## 2013-01-25 DIAGNOSIS — M6281 Muscle weakness (generalized): Secondary | ICD-10-CM | POA: Insufficient documentation

## 2013-01-25 DIAGNOSIS — M25639 Stiffness of unspecified wrist, not elsewhere classified: Secondary | ICD-10-CM | POA: Insufficient documentation

## 2013-01-25 DIAGNOSIS — M19019 Primary osteoarthritis, unspecified shoulder: Secondary | ICD-10-CM | POA: Insufficient documentation

## 2013-01-25 DIAGNOSIS — M25519 Pain in unspecified shoulder: Secondary | ICD-10-CM | POA: Insufficient documentation

## 2013-01-25 DIAGNOSIS — M25619 Stiffness of unspecified shoulder, not elsewhere classified: Secondary | ICD-10-CM | POA: Insufficient documentation

## 2013-01-25 DIAGNOSIS — IMO0001 Reserved for inherently not codable concepts without codable children: Secondary | ICD-10-CM | POA: Insufficient documentation

## 2013-01-25 NOTE — Evaluation (Signed)
Occupational Therapy Evaluation  Patient Details  Name: Jaime George MRN: [id number] Date of Birth: 31-Aug-1965  Today's Date: 01/25/2013 Time: 1150-1230 OT Time Calculation (min): 40 min OT eval 1150-1210 20' MFR 1210-1230 20'  Visit#: 1 of 12  Re-eval: 02/22/13  Assessment Diagnosis: Rt. Shoulder rotator cuff repair, SAD Surgical Date: 01/17/13 Next MD Visit: 02/06/13 - Cleophas Dunker  Authorization:    Authorization Time Period:    Authorization Visit#:   of     Past Medical History: No past medical history on file. Past Surgical History:  Past Surgical History  Procedure Laterality Date  . Knee surgery    . Laparoscopic appendectomy  07/18/2011    Procedure: APPENDECTOMY LAPAROSCOPIC;  Surgeon: Fabio Bering, MD;  Location: [facility];  Service: General;  Laterality: N/A;    Subjective Symptoms/Limitations Symptoms: S: My daughter is getting married in April and I was really working on fitting into the dress that I bought for her wedding.  Pertinent History: Approximately a year ago, patient was riding horses Western style and attempted to pick up saddle and throw on top of the horse. She felt something happen in her shoulder that wasn't right. Patient has since then received 3 cortizone shots. An MRI has shown a rotator cuff tear in the right shoulder. On January 17, 2013, patient  underwent a right shoulder scope  and SAD. Patient is wearing a sling for most of the day only taking it off at home at times. Dr. Cleophas Dunker has referred patient to occupational therapy for evaluation and treatment.  Special Tests: DASH score of 65.9 with ideal score of 0. Patient Stated Goals: To be able to use arm without pain. Wants to return to being active.  Pain Assessment Currently in Pain?: Yes Pain Score: 4  Pain Location: Shoulder Pain Orientation: Right Pain Type: Acute pain Pain Radiating Towards: throbbing Pain Relieving Factors: Motrin  Precautions/Restrictions   Precautions Precautions: Shoulder Type of Shoulder Precautions: Immediate pendulum exercises. AROM of elbow, wrist, and hand. PROM for 1st 6 weeks (until 03/01/13). Near full PROM by week 3-4. At 6 week mark: Begin AROM and AAROM to shoulder. Progress as tolerated.  Shoulder Interventions: Shoulder sling/immobilizer  Balance Screening Balance Screen Has the patient fallen in the past 6 months: No  Prior Functioning  Home Living Family/patient expects to be discharged to:: Private residence Living Arrangements: Spouse/significant other Available Help at Discharge: Family Prior Function Level of Independence: Independent with basic ADLs;Independent with gait  Able to Take Stairs?: Yes Driving: Yes Vocation: Full time employment Vocation Requirements: school Designer, multimedia.  (typing on the computer, answer e-mails.) Leisure: Hobbies-yes (Comment) Comments: Pt was attending Crossfit classes in [place]. Think the coaches were pushing her too hard and does not want to return. Would like to be able to lift handweights at home and return to being active. Daughter is graduating from college in May and getting married in April.   Assessment ADL/Vision/Perception ADL ADL Comments: Difficulty donning bra, drying hair, donning/shirt, reaching overhead to put clothes in closet. Dominant Hand: Right Vision - History Baseline Vision: No visual deficits  Cognition/Observation Cognition Overall Cognitive Status: Within Functional Limits for tasks assessed Arousal/Alertness: Awake/alert Orientation Level: Oriented X4 Observation/Other Assessments Observations: 3 incision mark on right shoulder.  Sensation/Coordination/Edema Edema Edema: Swelling and bruising noted in right upper arm.   Additional Assessments RUE Assessment RUE Assessment:  (assessed supine. IR/ER adducted) RUE AROM (degrees) Right Shoulder Flexion: 60 Degrees Right Shoulder ABduction: 53 Degrees Right Shoulder  Internal  Rotation: 70 Degrees Right Shoulder External Rotation: 23 Degrees RUE PROM (degrees) Right Shoulder Flexion: 80 Degrees Right Shoulder ABduction: 73 Degrees Right Shoulder Internal Rotation: 65 Degrees Right Shoulder External Rotation: 23 Degrees RUE Strength RUE Overall Strength Comments: Strength not assessed at this time. Palpation Palpation: Max fascial restrictions in right upper arm, trapezius, and scapularis region.      Exercise/Treatments     Manual Therapy Manual Therapy: Myofascial release Myofascial Release: MFR to right upper arm, trapezius, and scapularis region to decrease fascial restrictions and increase joint mobiilty in a pain free zone.   Occupational Therapy Assessment and Plan OT Assessment and Plan Clinical Impression Statement: A:  47year old female s/p right rotator cuff repair presents with increased pain and fascial restrictions and decreased A/PROM and strength, causing decreased I with all B/IADLs, work, and leisure activities.   Pt will benefit from skilled therapeutic intervention in order to improve on the following deficits: Increased fascial restricitons;Decreased range of motion;Pain;Decreased strength Rehab Potential: Excellent OT Frequency: Min 2X/week OT Duration: 6 weeks OT Treatment/Interventions: Therapeutic activities;Self-care/ADL training;Therapeutic exercise;Patient/family education;Manual therapy;Modalities OT Plan:  P:  Skilled OT intervention to decrease pain and fascial restrictions and increase pain free mobility in right shoulder, per protocol, in order to return to prior level of independence with all B/IADLs, work, and leisure activities.  Treatment Plan:  FOLLOW PROTOCOL:  PROM in supine, isometric strengthening in supine, bridges.  seated elev, ext, row, ball stretches, elbow-wrist AROM.      Goals Short Term Goals Time to Complete Short Term Goals: 4 weeks Short Term Goal 1: Patient will be educated on HEP.  Short  Term Goal 2: Patient will increase PROM to [facility] in her right shoulder for increased independence with donning and doffing shirts. Short Term Goal 3: Patient will have 3+/5 strength in her right shoulder in order to carry items needed for work with less difficulty. Short Term Goal 4: Patient will decrease pain to 2/10 when sleeping. Short Term Goal 5: Patient will decrease fascial restrictions from max to mod in her right shoulder region for increased mobility with daily activities Long Term Goals Time to Complete Long Term Goals: 8 weeks Long Term Goal 1: Patient will return to highest level of independence with all daily, leisure, and work activities.  Long Term Goal 2: Patient will increase AROM to WNL in her right shoulder for increased independence with reaching into overhead cabinets. Long Term Goal 3: Patient will have 5/5 strength in her right shoulder in order to carry items needed for work with less difficulty. Long Term Goal 4: Patient will decrease pain to 0/10 when getting dressed. Long Term Goal 5: Patient will decrease fascial restrictions from mod to min in her right shoulder region for increased mobility with donning and doffing bra.  Problem List Patient Active Problem List   Diagnosis Date Noted  . Muscle weakness (generalized) 01/25/2013  . Pain in joint, shoulder region 01/25/2013  . 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  Please sign and either send electronically or make a copy of this report for your files and return  this physician  signed original.  Please mark one 1.__approve of plan  2. ___approve of plan with the following conditions.   ______________________________                                                          _____________________ Physician Signature                                                                                                             Date

## 2013-01-29 ENCOUNTER — Ambulatory Visit (HOSPITAL_COMMUNITY)
Admission: RE | Admit: 2013-01-29 | Discharge: 2013-01-29 | Disposition: A | Discharge status: Home / Self Care | Payer: BC Managed Care – PPO | Source: Ambulatory Visit | Attending: Orthopaedic Surgery | Admitting: Orthopaedic Surgery

## 2013-01-29 NOTE — Progress Notes (Signed)
Occupational Therapy Treatment Patient Details  Name: Jaime George MRN: [id number] Date of Birth: 1966-05-10  Today's Date: 01/29/2013 Time: 4098-1191 OT Time Calculation (min): 43 min Manual Therapy 4782-9562 16' Therapeutic Exercises [phone] 17' Heat 10' Visit#: 2 of 12  Re-eval: 02/22/13     Subjective S:  Its not feeling great.  Limitations: Immediate pendulum exercises. AROM of elbow, wrist, and hand. PROM for 1st 6 weeks (until 03/01/13). Near full PROM by week 3-4. At 6 week mark: Begin AROM and AAROM to shoulder. Progress as tolerated.   Special Tests: DASH score of 65.9 with ideal score of 0. Pain Assessment Currently in Pain?: Yes Pain Score: 4  Pain Location: Shoulder Pain Orientation: Right  Precautions/Restrictions    Immediate pendulum exercises. AROM of elbow, wrist, and hand. PROM for 1st 6 weeks (until 03/01/13). Near full PROM by week 3-4. At 6 week mark: Begin AROM and AAROM to shoulder. Progress as tolerated.     Exercise/Treatments Supine Protraction: PROM;10 reps Horizontal ABduction: PROM;10 reps External Rotation: PROM;10 reps Internal Rotation: PROM;10 reps Flexion: PROM;10 reps ABduction: PROM;10 reps Other Supine Exercises: bridges X 15  Seated Elevation: AROM;15 reps Extension: AROM;15 reps Row: AROM;15 reps Other Seated Exercises: elbow flexion/extension, supination/pronation, wrist flexion/extension AROM X 15 Flexion: 20 reps ABduction: 20 reps Isometric Strengthening   Flexion: Supine;3X5" Extension: Supine;3X5" External Rotation: Supine;3X5" Internal Rotation: Supine;3X5" ABduction: Supine;3X5" ADduction: Supine;3X5"    Modalities Modalities: Moist Heat Manual Therapy Manual Therapy: Myofascial release Myofascial Release: MFR to right upper arm, shoulder region, scapular region, trapezius, and associated areas to decrease pain and fascial restrictions and increase pain free mobility.  Moist Heat Therapy Number Minutes  Moist Heat: 10 Minutes Moist Heat Location: Shoulder  Occupational Therapy Assessment and Plan OT Assessment and Plan Clinical Impression Statement: A:  Patient with 75% PROM in supine this date.   OT Plan: P:  Improve PROM to 100% in supine, and joint gliding exercises.    Goals Short Term Goals Time to Complete Short Term Goals: 4 weeks Short Term Goal 1: Patient will be educated on HEP.  Short Term Goal 1 Progress: Progressing toward goal Short Term Goal 2: Patient will increase PROM to [facility] in her right shoulder for increased independence with donning and doffing shirts. Short Term Goal 2 Progress: Progressing toward goal Short Term Goal 3: Patient will have 3+/5 strength in her right shoulder in order to carry items needed for work with less difficulty. Short Term Goal 3 Progress: Progressing toward goal Short Term Goal 4: Patient will decrease pain to 2/10 when sleeping. Short Term Goal 4 Progress: Progressing toward goal Short Term Goal 5: Patient will decrease fascial restrictions from max to mod in her right shoulder region for increased mobility with daily activities Short Term Goal 5 Progress: Progressing toward goal Long Term Goals Time to Complete Long Term Goals: 8 weeks Long Term Goal 1: Patient will return to highest level of independence with all daily, leisure, and work activities.  Long Term Goal 1 Progress: Progressing toward goal Long Term Goal 2: Patient will increase AROM to WNL in her right shoulder for increased independence with reaching into overhead cabinets. Long Term Goal 2 Progress: Progressing toward goal Long Term Goal 3: Patient will have 5/5 strength in her right shoulder in order to carry items needed for work with less difficulty. Long Term Goal 3 Progress: Progressing toward goal Long Term Goal 4: Patient will decrease pain to 0/10 when getting dressed. Long Term Goal 4  Progress: Progressing toward goal Long Term Goal 5: Patient will decrease  fascial restrictions from mod to min in her right shoulder region for increased mobility with donning and doffing bra. Long Term Goal 5 Progress: Progressing toward goal  Problem List Patient Active Problem List   Diagnosis Date Noted  . Muscle weakness (generalized) 01/25/2013  . Pain in joint, shoulder region 01/25/2013  . 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

## 2013-02-01 ENCOUNTER — Ambulatory Visit (HOSPITAL_COMMUNITY)
Admission: RE | Admit: 2013-02-01 | Discharge: 2013-02-01 | Disposition: A | Discharge status: Home / Self Care | Payer: BC Managed Care – PPO | Source: Ambulatory Visit | Attending: Orthopaedic Surgery | Admitting: Orthopaedic Surgery

## 2013-02-01 NOTE — Progress Notes (Signed)
Occupational Therapy Treatment Patient Details  Name: Jaime George MRN: [id number] Date of Birth: 08-14-1965  Today's Date: 02/01/2013 Time: 4098-1191 OT Time Calculation (min): 37 min Manual Therapy 4782-9562 16' Therapeutic Exercises 1308-6578 21' Visit#: 3 of 12  Re-eval: 02/22/13    Subjective S:  Its a good kind of hurt.  Limitations: Immediate pendulum exercises. AROM of elbow, wrist, and hand. PROM for 1st 6 weeks (until 03/01/13). Near full PROM by week 3-4. At 6 week mark: Begin AROM and AAROM to shoulder. Progress as tolerated.   Pain Assessment Currently in Pain?: Yes Pain Location: Shoulder Pain Orientation: Right Pain Type: Acute pain  Precautions/Restrictions    Immediate pendulum exercises. AROM of elbow, wrist, and hand. PROM for 1st 6 weeks (until 03/01/13). Near full PROM by week 3-4. At 6 week mark: Begin AROM and AAROM to shoulder. Progress as tolerated.    Exercise/Treatments Supine Protraction: PROM;10 reps Horizontal ABduction: PROM;10 reps External Rotation: PROM;10 reps Internal Rotation: PROM;10 reps Flexion: PROM;10 reps ABduction: PROM;10 reps Other Supine Exercises: bridges x 25 Seated Elevation: AROM;15 reps Extension: AROM;15 reps Row: AROM;15 reps Other Seated Exercises: elbow flexion/extension, supination/pronation, wrist flexion/extension AROM X 15 Pulleys Flexion: 1 minute ABduction: 1 minute Therapy Ball Flexion: 20 reps ABduction: 20 reps ROM / Strengthening / Isometric Strengthening Anterior Glide: 5 X 5" Caudal Glide: 5 X 5"  Flexion: Supine;5X5" Extension: Supine;5X5" External Rotation: Supine;5X5" Internal Rotation: Supine;5X5" ABduction: Supine;5X5" ADduction: Supine;5X5"    Manual Therapy Manual Therapy: Myofascial release Myofascial Release: MFR to right upper arm, shoulder region, scapular region, trapezius, and associated areas to decrease pain and fascial restrictions and increase pain free  mobility  Occupational Therapy Assessment and Plan OT Assessment and Plan Clinical Impression Statement: A:  Patient has full flexion and abduction PROM in supine this date. OT Plan: P:  Reassess for md visit on 8/27, improve ER/IR to [facility] PROM.   Goals Short Term Goals Time to Complete Short Term Goals: 4 weeks Short Term Goal 1: Patient will be educated on HEP.  Short Term Goal 2: Patient will increase PROM to [facility] in her right shoulder for increased independence with donning and doffing shirts. Short Term Goal 3: Patient will have 3+/5 strength in her right shoulder in order to carry items needed for work with less difficulty. Short Term Goal 4: Patient will decrease pain to 2/10 when sleeping. Short Term Goal 5: Patient will decrease fascial restrictions from max to mod in her right shoulder region for increased mobility with daily activities Long Term Goals Time to Complete Long Term Goals: 8 weeks Long Term Goal 1: Patient will return to highest level of independence with all daily, leisure, and work activities.  Long Term Goal 2: Patient will increase AROM to WNL in her right shoulder for increased independence with reaching into overhead cabinets. Long Term Goal 3: Patient will have 5/5 strength in her right shoulder in order to carry items needed for work with less difficulty. Long Term Goal 4: Patient will decrease pain to 0/10 when getting dressed. Long Term Goal 5: Patient will decrease fascial restrictions from mod to min in her right shoulder region for increased mobility with donning and doffing bra.  Problem List Patient Active Problem List   Diagnosis Date Noted  . Muscle weakness (generalized) 01/25/2013  . Pain in joint, shoulder region 01/25/2013  . Arthropathy of shoulder region 01/25/2013    End of Session Activity Tolerance: Patient tolerated treatment well General Behavior During Therapy: [facility] for  tasks assessed/performed  GO    Shirlean Mylar,  OTR/L  02/01/2013, 4:04 PM

## 2013-02-05 ENCOUNTER — Ambulatory Visit (HOSPITAL_COMMUNITY)
Admission: RE | Admit: 2013-02-05 | Discharge: 2013-02-05 | Disposition: A | Discharge status: Home / Self Care | Payer: BC Managed Care – PPO | Source: Ambulatory Visit

## 2013-02-05 NOTE — Evaluation (Signed)
Occupational Therapy Re-Evaluation  Patient Details  Name: Jaime George MRN: [id number] Date of Birth: February 28, 1966  Today's Date: 02/05/2013 Time: 4098-1191 OT Time Calculation (min): 35 min MFR 1650-1700 10' Therex 1700-1709 9' Reassess 4782-9562 16'  Visit#: 4 of 12  Re-eval: 03/05/13  Assessment Diagnosis: Rt. Shoulder rotator cuff repair, SAD  Authorization:    Authorization Time Period:    Authorization Visit#:   of     Past Medical History: No past medical history on file. Past Surgical History:  Past Surgical History  Procedure Laterality Date  . Knee surgery    . 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  Special Tests: DASH score of  31.8 with ideal score of 0. (on eval: 65.9) Pain Assessment Currently in Pain?: Yes Pain Score: 1  Pain Location: Shoulder Pain Orientation: Right Pain Type: Acute pain  Precautions/Restrictions  Precautions Precautions: Shoulder Type of Shoulder Precautions: Immediate pendulum exercises. AROM of elbow, wrist, and hand. PROM for 1st 6 weeks (until 03/01/13). Near full PROM by week 3-4. At 6 week mark: Begin AROM and AAROM to shoulder. Progress as tolerated.    Assessment Additional Assessments RUE Assessment RUE Assessment:  (assessed supine. IR/ER adducted) RUE AROM (degrees) Right Shoulder Flexion: 171 Degrees (on eval: 60) Right Shoulder ABduction: 126 Degrees (on eval: 53) Right Shoulder Internal Rotation: 90 Degrees (on eval: 70) Right Shoulder External Rotation: 80 Degrees (on eval: 23) RUE PROM (degrees) Right Shoulder Flexion: 173 Degrees (on eval: 80) Right Shoulder ABduction: 126 Degrees (on eval: 73) Right Shoulder Internal Rotation: 90 Degrees (on eval: 65) Right Shoulder  External Rotation: 85 Degrees (on eval: 23) RUE Strength RUE Overall Strength Comments: strength was not assessed at eval. Right Shoulder Flexion: 3+/5 Right Shoulder ABduction: 3+/5 Right Shoulder Internal Rotation: 3+/5 Right Shoulder External Rotation: 3+/5 Palpation Palpation: Moderate fascial restrictions in right upper arm, trapezius, and scapularis region.      Exercise/Treatments Supine Protraction: PROM;10 reps Horizontal ABduction: PROM;10 reps External Rotation: PROM;10 reps Internal Rotation: PROM;10 reps Flexion: PROM;10 reps ABduction: PROM;10 reps    Manual Therapy Manual Therapy: Myofascial release Myofascial Release: MFR to right upper arm, shoulder region, scapular region, trapezius, and associated areas to decrease pain and fascial restrictions and increase pain free mobility  Occupational Therapy Assessment and Plan OT Assessment and Plan Clinical Impression Statement: A: See MD note for progres. Reassessment for patient's MD appt on 02/06/13. Patient has made great progress towards goals.  OT Frequency: Min 2X/week OT Duration: 6 weeks OT Plan: P: Cont. therapy 2x/week for 6 weeks.    Goals Short Term Goals Time to Complete Short Term Goals: 4 weeks Short Term Goal 1: Patient will be educated on HEP.  Short Term Goal 1 Progress: Met Short Term Goal 2: Patient will increase PROM to [facility] in her right shoulder for increased independence with donning and doffing shirts. Short Term Goal 2 Progress: Met Short Term Goal 3: Patient will have 3+/5 strength in her right shoulder in order to carry items needed for work with less difficulty. Short Term Goal 3 Progress: Met Short Term Goal 4: Patient will decrease pain to 2/10 when sleeping. Short Term Goal 4 Progress: Progressing toward goal Short Term Goal 5: Patient will decrease fascial restrictions from max to  mod in her right shoulder region for increased mobility with daily activities Short Term Goal 5  Progress: Met Long Term Goals Time to Complete Long Term Goals: 8 weeks Long Term Goal 1: Patient will return to highest level of independence with all daily, leisure, and work activities.  Long Term Goal 1 Progress: Progressing toward goal Long Term Goal 2: Patient will increase AROM to WNL in her right shoulder for increased independence with reaching into overhead cabinets. Long Term Goal 2 Progress: Met Long Term Goal 3: Patient will have 5/5 strength in her right shoulder in order to carry items needed for work with less difficulty. Long Term Goal 3 Progress: Progressing toward goal Long Term Goal 4: Patient will decrease pain to 0/10 when getting dressed. Long Term Goal 4 Progress: Progressing toward goal Long Term Goal 5: Patient will decrease fascial restrictions from mod to min in her right shoulder region for increased mobility with donning and doffing bra. Long Term Goal 5 Progress: Progressing toward goal  Problem List Patient Active Problem List   Diagnosis Date Noted  . Muscle weakness (generalized) 01/25/2013  . Pain in joint, shoulder region 01/25/2013  . Arthropathy of shoulder region 01/25/2013    End of Session Activity Tolerance: Patient tolerated treatment well General Behavior During Therapy: [facility] for tasks assessed/performed   Limmie Patricia, OTR/L,CBIS   02/05/2013, 5:37 PM  Physician Documentation Your signature is required to indicate approval of the treatment plan as stated above.  Please sign and either send electronically or make a copy of this report for your files and return this physician signed original.  Please mark one 1.__approve of plan  2. ___approve of plan with the following conditions.   ______________________________                                                          _____________________ Physician Signature                                                                                                             Date

## 2013-02-06 ENCOUNTER — Ambulatory Visit (HOSPITAL_COMMUNITY)
Admission: RE | Admit: 2013-02-06 | Discharge: 2013-02-06 | Disposition: A | Discharge status: Home / Self Care | Payer: BC Managed Care – PPO | Source: Ambulatory Visit | Attending: Orthopaedic Surgery | Admitting: Orthopaedic Surgery

## 2013-02-06 NOTE — Progress Notes (Signed)
Occupational Therapy Treatment Patient Details  Name: Jaime George MRN: [id number] Date of Birth: 1966/04/04  Today's Date: 02/06/2013 Time: 4098-1191 OT Time Calculation (min): 44 min MFR 4782-9562 15' Therex 1308-6578 29'  Visit#: 5 of 12  Re-eval: 03/05/13    Authorization:    Authorization Time Period:    Authorization Visit#:   of    Subjective Symptoms/Limitations Symptoms: S: I saw Dr. Cleophas Dunker today. I showed him what we've been working on in therapy. He wants me to do one more week of passive range of motion. Pain Assessment Currently in Pain?: Yes Pain Score: 3  Pain Location: Shoulder Pain Orientation: Right Pain Type: Acute pain  Precautions/Restrictions  Precautions Precautions: Shoulder Type of Shoulder Precautions: Cont. PROM until 01/13/13 then begin AAROM and progress to AROM.  Exercise/Treatments Supine Protraction: PROM;10 reps Horizontal ABduction: PROM;10 reps External Rotation: PROM;10 reps Internal Rotation: PROM;10 reps Flexion: PROM;10 reps ABduction: PROM;10 reps Other Supine Exercises: bridges x 25 Seated Elevation: AROM;15 reps Extension: AROM;15 reps Row: AROM;15 reps Other Seated Exercises: elbow flexion/extension, supination/pronation, wrist flexion/extension AROM X 15 Pulleys Flexion: 1 minute ABduction: 1 minute Therapy Ball Flexion: 20 reps ABduction: 20 reps ROM / Strengthening / Isometric Strengthening Thumb Tacks: 1' Anterior Glide: 5 X 5" Caudal Glide: 5 X 5"  Prot/Ret//Elev/Dep: 1'      Manual Therapy Manual Therapy: Myofascial release Myofascial Release: MFR to right upper arm, shoulder region, scapular region, trapezius, and associated areas to decrease pain and fascial restrictions and increase pain free mobility  Occupational Therapy Assessment and Plan OT Assessment and Plan Clinical Impression Statement: A: Added thumb tacks and pro/ret/elev/dep this date. Patient tolerated well.  OT Plan: P: Per  doctor's order: Continue PROM for one more week before beginning strengthening exercises to allow shoulder to heal. Resume isometric exercises.   Goals Short Term Goals Time to Complete Short Term Goals: 4 weeks Short Term Goal 1: Patient will be educated on HEP.  Short Term Goal 2: Patient will increase PROM to [facility] in her right shoulder for increased independence with donning and doffing shirts. Short Term Goal 3: Patient will have 3+/5 strength in her right shoulder in order to carry items needed for work with less difficulty. Short Term Goal 4: Patient will decrease pain to 2/10 when sleeping. Short Term Goal 5: Patient will decrease fascial restrictions from max to mod in her right shoulder region for increased mobility with daily activities Long Term Goals Time to Complete Long Term Goals: 8 weeks Long Term Goal 1: Patient will return to highest level of independence with all daily, leisure, and work activities.  Long Term Goal 2: Patient will increase AROM to WNL in her right shoulder for increased independence with reaching into overhead cabinets. Long Term Goal 3: Patient will have 5/5 strength in her right shoulder in order to carry items needed for work with less difficulty. Long Term Goal 4: Patient will decrease pain to 0/10 when getting dressed. Long Term Goal 5: Patient will decrease fascial restrictions from mod to min in her right shoulder region for increased mobility with donning and doffing bra.  Problem List Patient Active Problem List   Diagnosis Date Noted  . Muscle weakness (generalized) 01/25/2013  . Pain in joint, shoulder region 01/25/2013  . Arthropathy of shoulder region 01/25/2013    End of Session Activity Tolerance: Patient tolerated treatment well General Behavior During Therapy: [facility] for tasks assessed/performed   Limmie Patricia, OTR/L,CBIS   02/06/2013, 5:56 PM

## 2013-02-13 ENCOUNTER — Telehealth (HOSPITAL_COMMUNITY): Payer: Self-pay

## 2013-02-13 ENCOUNTER — Ambulatory Visit (HOSPITAL_COMMUNITY): Payer: No Typology Code available for payment source | Admitting: Specialist

## 2013-02-15 ENCOUNTER — Ambulatory Visit (HOSPITAL_COMMUNITY)
Admission: RE | Admit: 2013-02-15 | Discharge: 2013-02-15 | Disposition: A | Discharge status: Home / Self Care | Payer: BC Managed Care – PPO | Source: Ambulatory Visit | Attending: Orthopaedic Surgery | Admitting: Orthopaedic Surgery

## 2013-02-15 DIAGNOSIS — M25639 Stiffness of unspecified wrist, not elsewhere classified: Secondary | ICD-10-CM | POA: Insufficient documentation

## 2013-02-15 DIAGNOSIS — M25619 Stiffness of unspecified shoulder, not elsewhere classified: Secondary | ICD-10-CM | POA: Insufficient documentation

## 2013-02-15 DIAGNOSIS — M6281 Muscle weakness (generalized): Secondary | ICD-10-CM | POA: Insufficient documentation

## 2013-02-15 DIAGNOSIS — M25519 Pain in unspecified shoulder: Secondary | ICD-10-CM | POA: Insufficient documentation

## 2013-02-15 DIAGNOSIS — IMO0001 Reserved for inherently not codable concepts without codable children: Secondary | ICD-10-CM | POA: Insufficient documentation

## 2013-02-15 NOTE — Progress Notes (Signed)
Occupational Therapy Treatment Patient Details  Name: Jaime George MRN: [id number] Date of Birth: Sep 10, 1965  Today's Date: 02/15/2013 Time: 8413-2440 OT Time Calculation (min): 36 min Manual Therapy 102-725 14' Therapeutic Exercises 825-847 22' Visit#: 6 of 12  Re-eval: 03/05/13    Subjective  S: Its been a bit sore lately - prbably because I have been using it more.  Limitations: Immediate pendulum exercises. AROM of elbow, wrist, and hand. PROM for 1st 6 weeks (until 03/01/13). Near full PROM by week 3-4. At 6 week mark: Begin AROM and AAROM to shoulder. Progress as tolerated.   Pain Assessment Currently in Pain?: Yes Pain Score: 2  Pain Location: Shoulder Pain Orientation: Right Pain Type: Acute pain  Precautions/Restrictions   AAROM - progress to AROM.  Exercise/Treatments Supine Protraction: PROM;AAROM;10 reps Horizontal ABduction: PROM;AAROM;10 reps External Rotation: PROM;AAROM;10 reps Internal Rotation: PROM;AAROM;10 reps Flexion: PROM;AAROM;10 reps ABduction: PROM;AAROM;10 reps Seated Elevation: AROM;10 reps Extension: AROM;10 reps Row: AROM;10 reps Therapy Ball Flexion: 20 reps ABduction: 20 reps Right/Left: 5 reps ROM / Strengthening / Isometric Strengthening Wall Wash: 1' Thumb Tacks: 1' Prot/Ret//Elev/Dep: 1'      Manual Therapy Manual Therapy: Myofascial release Myofascial Release: MFR to right upper arm, shoulder region, scapular region, trapezius, and associated areas to decrease pain and fascial restrictions and increase pain free mobility  Occupational Therapy Assessment and Plan OT Assessment and Plan Clinical Impression Statement: A:  Added AAROM, per MD order.  Patient with full AAROM in supine. OT Plan: P:  Attempt AAROM in seated.     Goals Short Term Goals Time to Complete Short Term Goals: 4 weeks Short Term Goal 1: Patient will be educated on HEP.  Short Term Goal 2: Patient will increase PROM to [facility] in her right shoulder for  increased independence with donning and doffing shirts. Short Term Goal 3: Patient will have 3+/5 strength in her right shoulder in order to carry items needed for work with less difficulty. Short Term Goal 4: Patient will decrease pain to 2/10 when sleeping. Short Term Goal 4 Progress: Progressing toward goal Short Term Goal 5: Patient will decrease fascial restrictions from max to mod in her right shoulder region for increased mobility with daily activities Long Term Goals Time to Complete Long Term Goals: 8 weeks Long Term Goal 1: Patient will return to highest level of independence with all daily, leisure, and work activities.  Long Term Goal 1 Progress: Progressing toward goal Long Term Goal 2: Patient will increase AROM to WNL in her right shoulder for increased independence with reaching into overhead cabinets. Long Term Goal 2 Progress: Progressing toward goal Long Term Goal 3: Patient will have 5/5 strength in her right shoulder in order to carry items needed for work with less difficulty. Long Term Goal 3 Progress: Progressing toward goal Long Term Goal 4: Patient will decrease pain to 0/10 when getting dressed. Long Term Goal 4 Progress: Progressing toward goal Long Term Goal 5: Patient will decrease fascial restrictions from mod to min in her right shoulder region for increased mobility with donning and doffing bra. Long Term Goal 5 Progress: Progressing toward goal  Problem List Patient Active Problem List   Diagnosis Date Noted  . Muscle weakness (generalized) 01/25/2013  . Pain in joint, shoulder region 01/25/2013  . Arthropathy of shoulder region 01/25/2013    End of Session Activity Tolerance: Patient tolerated treatment well General Behavior During Therapy: [facility] for tasks assessed/performed  GO    Shirlean Mylar, OTR/L  02/15/2013, 9:22 AM

## 2013-02-20 ENCOUNTER — Ambulatory Visit (HOSPITAL_COMMUNITY)
Admission: RE | Admit: 2013-02-20 | Discharge: 2013-02-20 | Disposition: A | Discharge status: Home / Self Care | Payer: BC Managed Care – PPO | Source: Ambulatory Visit | Attending: Internal Medicine | Admitting: Internal Medicine

## 2013-02-20 NOTE — Progress Notes (Signed)
Occupational Therapy Treatment Patient Details  Name: Jaime George MRN: [id number] Date of Birth: [birth year]  Today's Date: 02/20/2013 Time: 0347-4259 OT Time Calculation (min): 50 min Manual Therapy 5638-7564 22' Therapeutic exercises 3329-5188 28' Visit#: 7 of 12  Re-eval: 03/05/13     Subjective S:  Lavenia Atlas been working out some and my shoulder is sore.  Limitations: Immediate pendulum exercises. AROM of elbow, wrist, and hand. PROM for 1st 6 weeks (until 03/01/13). Near full PROM by week 3-4. At 6 week mark: Begin AROM and AAROM to shoulder. Progress as tolerated.   Pain Assessment Currently in Pain?: Yes Pain Score: 4  Pain Location: Shoulder Pain Orientation: Right Pain Type: Acute pain  Precautions/Restrictions    AROM of elbow, wrist, and hand. PROM for 1st 6 weeks (until 03/01/13). Near full PROM by week 3-4. At 6 week mark: Begin AROM and AAROM to shoulder. Progress as tolerated.    Exercise/Treatments Supine Protraction: PROM;10 reps;AAROM;15 reps Horizontal ABduction: PROM;10 reps;AAROM;15 reps External Rotation: PROM;10 reps;AAROM;15 reps Internal Rotation: PROM;10 reps;AAROM;15 reps Flexion: PROM;10 reps;AAROM;15 reps ABduction: PROM;10 reps;AAROM;15 reps Seated Protraction: AAROM;10 reps Horizontal ABduction: AAROM;10 reps External Rotation: AAROM;10 reps Internal Rotation: AAROM;10 reps Flexion: AAROM;10 reps Abduction: AAROM;10 reps Pulleys Flexion: 2 minutes ABduction: 2 minutes Therapy Ball Flexion: 20 reps ABduction: 20 reps Right/Left: 5 reps ROM / Strengthening / Isometric Strengthening Wall Wash: 2' Thumb Tacks: 1' Prot/Ret//Elev/Dep: 1'      Manual Therapy Manual Therapy: Myofascial release Myofascial Release: MFR to right upper arm, shoulder region, scapular region, trapezius, and associated areas to decrease pain and fascial restrictions and increase pain free mobility  Occupational Therapy Assessment and Plan OT Assessment and  Plan Clinical Impression Statement: A:  Added AAROM to seated exercises.   OT Plan: P:  Increase seated repetitions with dowel rod exercises.     Goals Short Term Goals Time to Complete Short Term Goals: 4 weeks Short Term Goal 1: Patient will be educated on HEP.  Short Term Goal 2: Patient will increase PROM to [facility] in her right shoulder for increased independence with donning and doffing shirts. Short Term Goal 3: Patient will have 3+/5 strength in her right shoulder in order to carry items needed for work with less difficulty. Short Term Goal 4: Patient will decrease pain to 2/10 when sleeping. Short Term Goal 4 Progress: Progressing toward goal Short Term Goal 5: Patient will decrease fascial restrictions from max to mod in her right shoulder region for increased mobility with daily activities Long Term Goals Time to Complete Long Term Goals: 8 weeks Long Term Goal 1: Patient will return to highest level of independence with all daily, leisure, and work activities.  Long Term Goal 1 Progress: Progressing toward goal Long Term Goal 2: Patient will increase AROM to WNL in her right shoulder for increased independence with reaching into overhead cabinets. Long Term Goal 2 Progress: Progressing toward goal Long Term Goal 3: Patient will have 5/5 strength in her right shoulder in order to carry items needed for work with less difficulty. Long Term Goal 3 Progress: Progressing toward goal Long Term Goal 4: Patient will decrease pain to 0/10 when getting dressed. Long Term Goal 4 Progress: Progressing toward goal Long Term Goal 5: Patient will decrease fascial restrictions from mod to min in her right shoulder region for increased mobility with donning and doffing bra. Long Term Goal 5 Progress: Progressing toward goal  Problem List Patient Active Problem List   Diagnosis Date Noted  .  Muscle weakness (generalized) 01/25/2013  . Pain in joint, shoulder region 01/25/2013  . Arthropathy of  shoulder region 01/25/2013    End of Session Activity Tolerance: Patient tolerated treatment well General Behavior During Therapy: [facility] for tasks assessed/performed OT Plan of Care OT Home Exercise Plan: dowel rod exercises OT Patient Instructions: handout Consulted and Agree with Plan of Care: Patient  GO    Shirlean Mylar, OTR/L  02/20/2013, 4:27 PM

## 2013-02-22 ENCOUNTER — Ambulatory Visit (HOSPITAL_COMMUNITY)
Admission: RE | Admit: 2013-02-22 | Discharge: 2013-02-22 | Disposition: A | Discharge status: Home / Self Care | Payer: BC Managed Care – PPO | Source: Ambulatory Visit | Attending: Internal Medicine | Admitting: Internal Medicine

## 2013-02-22 NOTE — Progress Notes (Signed)
Occupational Therapy Treatment Patient Details  Name: Jaime George MRN: [id number] Date of Birth: 1965-11-02  Today's Date: 02/22/2013 Time: 4098-1191 OT Time Calculation (min): 38 min MFR 4782-9562 24' Therex 1308-6578 14'  Visit#: 8 of 12  Re-eval: 03/05/13    Authorization:    Authorization Time Period:    Authorization Visit#:   of    Subjective Symptoms/Limitations Symptoms: S:  Ive been working out some and my shoulder is sore.  Pain Assessment Currently in Pain?: Yes Pain Score: 3  Pain Location: Shoulder Pain Orientation: Right Pain Type: Acute pain  Precautions/Restrictions  Precautions Precautions: Shoulder Type of Shoulder Precautions: Cont. PROM until 01/13/13 then begin AAROM and progress to AROM.  Exercise/Treatments Supine Protraction: PROM;10 reps;AAROM;15 reps Horizontal ABduction: PROM;10 reps;AAROM;15 reps External Rotation: PROM;10 reps;AAROM;15 reps Internal Rotation: PROM;10 reps;AAROM;15 reps Flexion: PROM;10 reps;AAROM;15 reps ABduction: PROM;10 reps;AAROM;15 reps Seated Protraction: AAROM;15 reps Horizontal ABduction: AAROM;15 reps External Rotation: AAROM;15 reps Internal Rotation: AAROM;15 reps Flexion: AAROM;15 reps Abduction: AAROM;15 reps Therapy Ball Flexion:  (d/c) ABduction:  (d/c) ROM / Strengthening / Isometric Strengthening Wall Wash: 2' Thumb Tacks: 1'       Manual Therapy Manual Therapy: Myofascial release Myofascial Release: MFR to right upper arm, shoulder region, scapular region, trapezius, and associated areas to decrease pain and fascial restrictions and increase pain free mobility  Occupational Therapy Assessment and Plan OT Assessment and Plan Clinical Impression Statement: A: Completed all AAROM seated and supine without difficulty. Increased rep with dowel rod without difficulty.  OT Plan: P: Attempt AROM supine.    Goals Short Term Goals Time to Complete Short Term Goals: 4 weeks Short Term Goal 1:  Patient will be educated on HEP.  Short Term Goal 2: Patient will increase PROM to [facility] in her right shoulder for increased independence with donning and doffing shirts. Short Term Goal 3: Patient will have 3+/5 strength in her right shoulder in order to carry items needed for work with less difficulty. Short Term Goal 4: Patient will decrease pain to 2/10 when sleeping. Short Term Goal 5: Patient will decrease fascial restrictions from max to mod in her right shoulder region for increased mobility with daily activities Long Term Goals Time to Complete Long Term Goals: 8 weeks Long Term Goal 1: Patient will return to highest level of independence with all daily, leisure, and work activities.  Long Term Goal 2: Patient will increase AROM to WNL in her right shoulder for increased independence with reaching into overhead cabinets. Long Term Goal 3: Patient will have 5/5 strength in her right shoulder in order to carry items needed for work with less difficulty. Long Term Goal 4: Patient will decrease pain to 0/10 when getting dressed. Long Term Goal 5: Patient will decrease fascial restrictions from mod to min in her right shoulder region for increased mobility with donning and doffing bra.  Problem List Patient Active Problem List   Diagnosis Date Noted  . Muscle weakness (generalized) 01/25/2013  . Pain in joint, shoulder region 01/25/2013  . Arthropathy of shoulder region 01/25/2013    End of Session Activity Tolerance: Patient tolerated treatment well General Behavior During Therapy: [facility] for tasks assessed/performed   Limmie Patricia, OTR/L,CBIS   02/22/2013, 3:42 PM

## 2013-02-26 ENCOUNTER — Ambulatory Visit (HOSPITAL_COMMUNITY)
Admission: RE | Admit: 2013-02-26 | Discharge: 2013-02-26 | Disposition: A | Discharge status: Home / Self Care | Payer: BC Managed Care – PPO | Source: Ambulatory Visit | Attending: Internal Medicine | Admitting: Internal Medicine

## 2013-02-26 NOTE — Progress Notes (Signed)
Occupational Therapy Treatment Patient Details  Name: Jaime George MRN: [id number] Date of Birth: April 13, 1966  Today's Date: 02/26/2013 Time: 4098-1191 OT Time Calculation (min): 42 min MFR 4782-9562 16' Therex 1308-6578 26'  Visit#: 9 of 12  Re-eval: 03/05/13    Authorization:    Authorization Time Period:    Authorization Visit#:   of    Subjective Symptoms/Limitations Symptoms: S: I'm using my arm a lot more so it's a little sore.  Pain Assessment Currently in Pain?: Yes Pain Score: 4  Pain Location: Shoulder Pain Orientation: Right Pain Type: Acute pain  Precautions/Restrictions  Precautions Precautions: Shoulder Type of Shoulder Precautions: Cont. PROM until 01/13/13 then begin AAROM and progress to AROM.  Exercise/Treatments Supine Protraction: PROM;10 reps;AROM;15 reps Horizontal ABduction: PROM;10 reps;AROM;15 reps External Rotation: PROM;10 reps;AROM;15 reps Internal Rotation: PROM;10 reps;AROM;15 reps Flexion: PROM;10 reps;AROM;15 reps ABduction: PROM;10 reps;AROM;15 reps Seated Protraction: AAROM;15 reps Horizontal ABduction: AAROM;15 reps External Rotation: AAROM;15 reps Internal Rotation: AAROM;15 reps Flexion: AAROM;15 reps Abduction: AAROM;15 reps ROM / Strengthening / Isometric Strengthening UBE (Upper Arm Bike): 1.0 3' reverse 3' forward Wall Wash: 2' Prot/Ret//Elev/Dep: d/c       Manual Therapy Manual Therapy: Myofascial release Myofascial Release: MFR to right upper arm, shoulder region, scapular region, trapezius, and associated areas to decrease pain and fascial restrictions and increase pain free mobility  Occupational Therapy Assessment and Plan OT Assessment and Plan Clinical Impression Statement: A: Added AROM supine and UBE bike. Patient tolerated well.  OT Plan: P: Attempt AROM seated.    Goals Short Term Goals Time to Complete Short Term Goals: 4 weeks Short Term Goal 1: Patient will be educated on HEP.  Short Term Goal  2: Patient will increase PROM to [facility] in her right shoulder for increased independence with donning and doffing shirts. Short Term Goal 3: Patient will have 3+/5 strength in her right shoulder in order to carry items needed for work with less difficulty. Short Term Goal 4: Patient will decrease pain to 2/10 when sleeping. Short Term Goal 5: Patient will decrease fascial restrictions from max to mod in her right shoulder region for increased mobility with daily activities Long Term Goals Time to Complete Long Term Goals: 8 weeks Long Term Goal 1: Patient will return to highest level of independence with all daily, leisure, and work activities.  Long Term Goal 1 Progress: Progressing toward goal Long Term Goal 2: Patient will increase AROM to WNL in her right shoulder for increased independence with reaching into overhead cabinets. Long Term Goal 2 Progress: Progressing toward goal Long Term Goal 3: Patient will have 5/5 strength in her right shoulder in order to carry items needed for work with less difficulty. Long Term Goal 3 Progress: Progressing toward goal Long Term Goal 4: Patient will decrease pain to 0/10 when getting dressed. Long Term Goal 4 Progress: Progressing toward goal Long Term Goal 5: Patient will decrease fascial restrictions from mod to min in her right shoulder region for increased mobility with donning and doffing bra. Long Term Goal 5 Progress: Progressing toward goal  Problem List Patient Active Problem List   Diagnosis Date Noted  . Muscle weakness (generalized) 01/25/2013  . Pain in joint, shoulder region 01/25/2013  . Arthropathy of shoulder region 01/25/2013    End of Session Activity Tolerance: Patient tolerated treatment well General Behavior During Therapy: [facility] [place] for tasks assessed/performed   Limmie Patricia, OTR/L,CBIS   02/26/2013, 4:00 PM

## 2013-02-28 ENCOUNTER — Telehealth (HOSPITAL_COMMUNITY): Payer: Self-pay

## 2013-02-28 ENCOUNTER — Ambulatory Visit (HOSPITAL_COMMUNITY): Payer: No Typology Code available for payment source | Admitting: Specialist

## 2013-03-05 ENCOUNTER — Ambulatory Visit (HOSPITAL_COMMUNITY): Payer: No Typology Code available for payment source

## 2013-03-05 ENCOUNTER — Ambulatory Visit (HOSPITAL_COMMUNITY)
Admission: RE | Admit: 2013-03-05 | Discharge: 2013-03-05 | Disposition: A | Discharge status: Home / Self Care | Payer: BC Managed Care – PPO | Source: Ambulatory Visit | Attending: Internal Medicine | Admitting: Internal Medicine

## 2013-03-05 NOTE — Evaluation (Signed)
Occupational Therapy Re Evaluation  Patient Details  Name: Jaime George MRN: [id number] Date of Birth: 06-24-1965  Today's Date: 03/05/2013 Time: 9629-5284 OT Time Calculation (min): 43 min Manual therapy 1324-4010 22' Reassess 2725-3664 5' Therapeutic exercises [phone] 16' Visit#: 10 of 16  Re-eval: 04/02/13  Assessment Diagnosis: Rt. Shoulder rotator cuff repair, SAD   Past Medical History: No past medical history on file. Past Surgical History:  Past Surgical History  Procedure Laterality Date  . Knee surgery    . Laparoscopic appendectomy  07/18/2011    Procedure: APPENDECTOMY LAPAROSCOPIC;  Surgeon: Fabio Bering, MD;  Location: [facility];  Service: General;  Laterality: N/A;    Subjective S:  I go to the MD tomorrow. Limitations: Immediate pendulum exercises. AROM of elbow, wrist, and hand. PROM for 1st 6 weeks (until 03/01/13). Near full PROM by week 3-4. At 6 week mark: Begin AROM and AAROM to shoulder. Progress as tolerated.   Special Tests: DASH score is 29.54 and was 31.8 with ideal score being 0. Pain Assessment Currently in Pain?: Yes Pain Score: 2  Pain Location: Shoulder Pain Orientation: Right Pain Type: Acute pain   Assessment Additional Assessments RUE AROM (degrees) RUE Overall AROM Comments: assessed in seated ER/IR with shoulder abd (last assessment supine ER/IR with shoulder adducted) Right Shoulder Flexion: 170 Degrees (171) Right Shoulder ABduction: 160 Degrees (126) Right Shoulder Internal Rotation: 70 Degrees (90) Right Shoulder External Rotation: 88 Degrees (80) RUE PROM (degrees) RUE Overall PROM Comments: WFL in supine RUE Strength RUE Overall Strength Comments: strength assessed in seated    shoulder flexion 5/5 (3+/5) Abduction 4/5 (3+/5) External rotation 4/5 (3+/5) Internal rotation 4/5 (3+/5)  Exercise/Treatments Supine Protraction: PROM;10 reps Horizontal ABduction: PROM;10 reps External Rotation: PROM;10 reps Internal  Rotation: PROM;10 reps Flexion: PROM;10 reps ABduction: PROM;10 reps Seated Protraction: AROM;10 reps Horizontal ABduction: AROM;10 reps External Rotation: AROM;10 reps Internal Rotation: AROM;10 reps Flexion: AROM;10 reps Abduction: AROM;10 reps   ROM / Strengthening / Isometric Strengthening UBE (Upper Arm Bike): 1.0 3' reverse 3' forward Wall Wash: 3'   Stretches Internal Rotation Stretch: 10 seconds    Manual Therapy Manual Therapy: Myofascial release Myofascial Release: MFR to right upper arm, shoulder region, scapular region, trapezius, and associated areas to decrease pain and fascial restrictions and increase pain free mobility  Occupational Therapy Assessment and Plan OT Assessment and Plan Clinical Impression Statement: A:  Increased independence with most daily activities.  She is having difficulty reaching behind her back and into the back seat of her car.  OT Frequency: Min 2X/week OT Duration: 4 weeks OT Plan: P:  Increase IR to [facility] AROM and increase shoulder strength to 5/5 for return to prior level of independence with daily and work activities.    Goals Short Term Goals Time to Complete Short Term Goals: 4 weeks Short Term Goal 1: Patient will be educated on HEP.  Short Term Goal 1 Progress: Met Short Term Goal 2: Patient will increase PROM to [facility] in her right shoulder for increased independence with donning and doffing shirts. Short Term Goal 2 Progress: Met Short Term Goal 3: Patient will have 3+/5 strength in her right shoulder in order to carry items needed for work with less difficulty. Short Term Goal 3 Progress: Met Short Term Goal 4: Patient will decrease pain to 2/10 when sleeping. Short Term Goal 4 Progress: Met Short Term Goal 5: Patient will decrease fascial restrictions from max to mod in her right shoulder region for increased mobility  with daily activities Short Term Goal 5 Progress: Met Long Term Goals Time to Complete Long Term Goals: 8  weeks Long Term Goal 1: Patient will return to highest level of independence with all daily, leisure, and work activities.  Long Term Goal 1 Progress: Progressing toward goal Long Term Goal 2: Patient will increase AROM to WNL in her right shoulder for increased independence with reaching into overhead cabinets. Long Term Goal 2 Progress: Progressing toward goal Long Term Goal 3: Patient will have 5/5 strength in her right shoulder in order to carry items needed for work with less difficulty. Long Term Goal 3 Progress: Progressing toward goal Long Term Goal 4: Patient will decrease pain to 0/10 when getting dressed. Long Term Goal 4 Progress: Progressing toward goal Long Term Goal 5: Patient will decrease fascial restrictions from mod to min in her right shoulder region for increased mobility with donning and doffing bra. Long Term Goal 5 Progress: Progressing toward goal  Problem List Patient Active Problem List   Diagnosis Date Noted  . Muscle weakness (generalized) 01/25/2013  . Pain in joint, shoulder region 01/25/2013  . Arthropathy of shoulder region 01/25/2013    End of Session Activity Tolerance: Patient tolerated treatment well General Behavior During Therapy: [facility] for tasks assessed/performed  Shirlean Mylar, OTR/L  03/05/2013, 4:57 PM  Physician Documentation Your signature is required to indicate approval of the treatment plan as stated above.  Please sign and either send electronically or make a copy of this report for your files and return this physician signed original.  Please mark one 1.__approve of plan  2. ___approve of plan with the following conditions.   ______________________________                                                          _____________________ Physician Signature                                                                                                             Date

## 2013-03-07 ENCOUNTER — Ambulatory Visit (HOSPITAL_COMMUNITY)
Admission: RE | Admit: 2013-03-07 | Discharge: 2013-03-07 | Disposition: A | Discharge status: Home / Self Care | Payer: BC Managed Care – PPO | Source: Ambulatory Visit | Attending: Internal Medicine | Admitting: Internal Medicine

## 2013-03-07 NOTE — Progress Notes (Signed)
Occupational Therapy Treatment Patient Details  Name: Jaime George MRN: [id number] Date of Birth: 29-Aug-1965  Today's Date: 03/07/2013 Time: 4098-1191 OT Time Calculation (min): 42 min MFR 1520-1535 15' Therex 4782-9562 27'  Visit#: 11 of 16  Re-eval: 04/02/13    Authorization:    Authorization Time Period:    Authorization Visit#:   of    Subjective Symptoms/Limitations Symptoms: S: My dr. was happy with my progress. He wants me to do 4 more weeks.  Pain Assessment Currently in Pain?: Yes Pain Score: 1  Pain Location: Shoulder Pain Orientation: Right Pain Type: Acute pain  Precautions/Restrictions  Precautions Precautions: Shoulder Type of Shoulder Precautions: Cont. PROM until 01/13/13 then begin AAROM and progress to AROM.  Exercise/Treatments Supine Protraction: PROM;Strengthening;Weights;10 reps Protraction Weight (lbs): 1 Horizontal ABduction: PROM;Strengthening;Weights;10 reps Horizontal ABduction Weight (lbs): 1 External Rotation: PROM;Strengthening;Weights;10 reps External Rotation Weight (lbs): 1 Internal Rotation: PROM;Strengthening;Weights;10 reps Internal Rotation Weight (lbs): 1 Flexion: PROM;Strengthening;Weights;10 reps Shoulder Flexion Weight (lbs): 1 ABduction: PROM;Strengthening;Weights;10 reps Shoulder ABduction Weight (lbs): 1 Seated Protraction: Strengthening;Weights;10 reps Protraction Weight (lbs): 1 Horizontal ABduction: Strengthening;Weights;10 reps Horizontal ABduction Weight (lbs): 1 External Rotation: Strengthening;Weights;10 reps External Rotation Weight (lbs): 1 Internal Rotation: Strengthening;Weights;10 reps Internal Rotation Weight (lbs): 1 Flexion: Strengthening;Weights;10 reps Flexion Weight (lbs): 1 Abduction: Strengthening;Weights;10 reps ABduction Weight (lbs): 1 ROM / Strengthening / Isometric Strengthening UBE (Upper Arm Bike): 1.0 3' reverse 3' forward Wall Wash: 3' "W" Arms: 10X  X to V Arms: 10X Proximal  Shoulder Strengthening, Supine: 10X with 1# Proximal Shoulder Strengthening, Seated: 10X       Manual Therapy Manual Therapy: Myofascial release Myofascial Release: MFR to right upper arm, shoulder region, scapular region, trapezius, and associated areas to decrease pain and fascial restrictions and increase pain free mobility  Occupational Therapy Assessment and Plan OT Assessment and Plan Clinical Impression Statement: A: Added 1# hand weight to supine and seated exercises, X to V arms, W arms. Patient tolerated well.  OT Plan: P: Add ball on the wall   Goals Short Term Goals Time to Complete Short Term Goals: 4 weeks Short Term Goal 1: Patient will be educated on HEP.  Short Term Goal 2: Patient will increase PROM to [facility] in her right shoulder for increased independence with donning and doffing shirts. Short Term Goal 3: Patient will have 3+/5 strength in her right shoulder in order to carry items needed for work with less difficulty. Short Term Goal 4: Patient will decrease pain to 2/10 when sleeping. Short Term Goal 5: Patient will decrease fascial restrictions from max to mod in her right shoulder region for increased mobility with daily activities Long Term Goals Time to Complete Long Term Goals: 8 weeks Long Term Goal 1: Patient will return to highest level of independence with all daily, leisure, and work activities.  Long Term Goal 2: Patient will increase AROM to WNL in her right shoulder for increased independence with reaching into overhead cabinets. Long Term Goal 3: Patient will have 5/5 strength in her right shoulder in order to carry items needed for work with less difficulty. Long Term Goal 4: Patient will decrease pain to 0/10 when getting dressed. Long Term Goal 5: Patient will decrease fascial restrictions from mod to min in her right shoulder region for increased mobility with donning and doffing bra.  Problem List Patient Active Problem List   Diagnosis Date  Noted  . Muscle weakness (generalized) 01/25/2013  . Pain in joint, shoulder region 01/25/2013  . Arthropathy of shoulder  region 01/25/2013    End of Session Activity Tolerance: Patient tolerated treatment well General Behavior During Therapy: [facility] for tasks assessed/performed  Limmie Patricia, OTR/L,CBIS   03/07/2013, 4:04 PM

## 2013-12-18 ENCOUNTER — Other Ambulatory Visit: Payer: Self-pay

## 2013-12-18 DIAGNOSIS — Z1231 Encounter for screening mammogram for malignant neoplasm of breast: Secondary | ICD-10-CM

## 2013-12-27 ENCOUNTER — Ambulatory Visit
Admission: RE | Admit: 2013-12-27 | Discharge: 2013-12-27 | Disposition: A | Discharge status: Home / Self Care | Payer: BC Managed Care – PPO | Source: Ambulatory Visit

## 2013-12-27 DIAGNOSIS — Z1231 Encounter for screening mammogram for malignant neoplasm of breast: Secondary | ICD-10-CM

## 2014-05-05 ENCOUNTER — Other Ambulatory Visit (HOSPITAL_COMMUNITY): Payer: Self-pay | Admitting: Orthopaedic Surgery

## 2014-05-05 DIAGNOSIS — M25561 Pain in right knee: Secondary | ICD-10-CM

## 2014-05-07 ENCOUNTER — Ambulatory Visit (HOSPITAL_COMMUNITY)
Admission: RE | Admit: 2014-05-07 | Discharge: 2014-05-07 | Disposition: A | Discharge status: Home / Self Care | Payer: BC Managed Care – PPO | Source: Ambulatory Visit | Attending: Orthopaedic Surgery | Admitting: Orthopaedic Surgery

## 2014-05-07 ENCOUNTER — Encounter (HOSPITAL_COMMUNITY): Payer: Self-pay

## 2014-05-07 DIAGNOSIS — M179 Osteoarthritis of knee, unspecified: Secondary | ICD-10-CM | POA: Insufficient documentation

## 2014-05-07 DIAGNOSIS — M25561 Pain in right knee: Secondary | ICD-10-CM | POA: Diagnosis present

## 2014-05-07 DIAGNOSIS — R937 Abnormal findings on diagnostic imaging of other parts of musculoskeletal system: Secondary | ICD-10-CM | POA: Diagnosis not present

## 2015-01-15 ENCOUNTER — Other Ambulatory Visit: Payer: Self-pay

## 2015-01-15 DIAGNOSIS — Z1231 Encounter for screening mammogram for malignant neoplasm of breast: Secondary | ICD-10-CM

## 2015-01-20 ENCOUNTER — Ambulatory Visit: Payer: BC Managed Care – PPO

## 2015-01-20 ENCOUNTER — Ambulatory Visit
Admission: RE | Admit: 2015-01-20 | Discharge: 2015-01-20 | Disposition: A | Discharge status: Home / Self Care | Payer: BC Managed Care – PPO | Source: Ambulatory Visit

## 2015-01-20 DIAGNOSIS — Z1231 Encounter for screening mammogram for malignant neoplasm of breast: Secondary | ICD-10-CM

## 2016-01-04 ENCOUNTER — Other Ambulatory Visit: Payer: Self-pay | Admitting: Internal Medicine

## 2016-01-04 DIAGNOSIS — Z1231 Encounter for screening mammogram for malignant neoplasm of breast: Secondary | ICD-10-CM

## 2016-01-25 ENCOUNTER — Ambulatory Visit
Admission: RE | Admit: 2016-01-25 | Discharge: 2016-01-25 | Disposition: A | Discharge status: Home / Self Care | Payer: BC Managed Care – PPO | Source: Ambulatory Visit | Attending: Internal Medicine | Admitting: Internal Medicine

## 2016-01-25 DIAGNOSIS — Z1231 Encounter for screening mammogram for malignant neoplasm of breast: Secondary | ICD-10-CM

## 2016-08-10 ENCOUNTER — Other Ambulatory Visit (INDEPENDENT_AMBULATORY_CARE_PROVIDER_SITE_OTHER): Payer: Self-pay

## 2016-08-10 ENCOUNTER — Ambulatory Visit (INDEPENDENT_AMBULATORY_CARE_PROVIDER_SITE_OTHER): Payer: BC Managed Care – PPO | Admitting: Orthopaedic Surgery

## 2016-08-10 ENCOUNTER — Encounter (INDEPENDENT_AMBULATORY_CARE_PROVIDER_SITE_OTHER): Payer: Self-pay | Admitting: Orthopaedic Surgery

## 2016-08-10 VITALS — Resp 14 | Ht 64.0 in | Wt 200.0 lb

## 2016-08-10 DIAGNOSIS — G8929 Other chronic pain: Secondary | ICD-10-CM | POA: Diagnosis not present

## 2016-08-10 DIAGNOSIS — M25562 Pain in left knee: Secondary | ICD-10-CM

## 2016-08-10 DIAGNOSIS — M2242 Chondromalacia patellae, left knee: Secondary | ICD-10-CM

## 2016-08-10 DIAGNOSIS — M25561 Pain in right knee: Secondary | ICD-10-CM | POA: Diagnosis not present

## 2016-08-10 MED ORDER — BUPIVACAINE HCL 0.5 % IJ SOLN
3.0000 mL | INTRAMUSCULAR | Status: AC | PRN
  Administered 2016-08-10: 3 mL via INTRA_ARTICULAR

## 2016-08-10 MED ORDER — METHYLPREDNISOLONE ACETATE 40 MG/ML IJ SUSP
80.0000 mg | INTRAMUSCULAR | Status: AC | PRN
  Administered 2016-08-10: 80 mg

## 2016-08-10 MED ORDER — LIDOCAINE HCL 1 % IJ SOLN
5.0000 mL | INTRAMUSCULAR | Status: AC | PRN
  Administered 2016-08-10: 5 mL

## 2016-08-10 NOTE — Progress Notes (Signed)
Office Visit Note   Patient: Jaime George           Date of Birth: 02-Feb-1966           MRN: [id number] Visit Date: 08/10/2016              Requested by: Asencion Noble, MD [address] [place], [place] [address] PCP: Asencion Noble, MD   Assessment & Plan: Visit Diagnoses: Bilateral chondromalacia patella   Plan: Inject more symptomatic right knee with cortisone, course of physical therapy, discussion of weight loss, return to office in 2 weeks to inject left knee.  Follow-Up Instructions: No Follow-up on file.   Orders:  No orders of the defined types were placed in this encounter.  No orders of the defined types were placed in this encounter.     Procedures: Large Joint Inj Date/Time: 08/10/2016 4:08 PM Performed by: Garald Balding Authorized by: Garald Balding   Consent Given by:  Patient Timeout: prior to procedure the correct patient, procedure, and site was verified   Indications:  Pain and joint swelling Location:  Knee Site:  R knee Prep: patient was prepped and draped in usual sterile fashion   Needle Size:  25 G Needle Length:  1.5 inches Approach:  Anteromedial Ultrasound Guidance: No   Fluoroscopic Guidance: No   Arthrogram: No   Medications:  5 mL lidocaine 1 %; 80 mg methylPREDNISolone acetate 40 MG/ML; 3 mL bupivacaine 0.5 % Aspiration Attempted: No   Patient tolerance:  Patient tolerated the procedure well with no immediate complications     Clinical Data: No additional findings.   Subjective: No chief complaint on file.   Ms. Boyatt presents with BIL knee pain with hx of multiple knee surgeries. She is in constant pain especially when climbing stairs. She takes Motrin for pain.  Jaime George relates that she's having more and more difficulty playing with her grandchildren particularly going up and down stairs or doing deep knee bends and squats. She has gained some weight and thinks that may have something to do with an exacerbation  of her pain. She's had prior right knee arthroscopy. Denies any recent injury or trauma.  Review of Systems   Objective: Vital Signs: There were no vitals taken for this visit.  Physical Exam  Ortho Exam Sam both knees consistent with chondromalacia patella. There is considerable crepitation more on the right than the left. No effusion. No instability. Mild medial joint pain more on right than left. No lateral joint pain. Full extension. Flexion over 100 without popliteal pain. No swelling distally. No calf pain. I spent a range of motion of either hip.  Specialty Comments:  No specialty comments available.  Imaging: No results found.   PMFS History: Patient Active Problem List   Diagnosis Date Noted  . Muscle weakness (generalized) 01/25/2013  . Pain in joint, shoulder region 01/25/2013  . 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  No family history on file.  Past Surgical History:  Procedure Laterality Date  . KNEE SURGERY    . LAPAROSCOPIC APPENDECTOMY  07/18/2011   Procedure: APPENDECTOMY LAPAROSCOPIC;  Surgeon: Donato Heinz, MD;  Location: [facility];  Service: General;  Laterality: N/A;   Social History   Occupational History  . Not on file.   Social History Main Topics  . Smoking status: Never Smoker  . Smokeless tobacco: Not on file  . Alcohol use No  . Drug use: No  .  Sexual activity: Yes    Birth control/ protection: None

## 2016-08-24 ENCOUNTER — Ambulatory Visit (INDEPENDENT_AMBULATORY_CARE_PROVIDER_SITE_OTHER): Payer: BC Managed Care – PPO | Admitting: Orthopaedic Surgery

## 2016-08-24 DIAGNOSIS — M25562 Pain in left knee: Secondary | ICD-10-CM

## 2016-08-24 DIAGNOSIS — G8929 Other chronic pain: Secondary | ICD-10-CM

## 2016-08-24 MED ORDER — BUPIVACAINE HCL 0.5 % IJ SOLN
3.0000 mL | INTRAMUSCULAR | Status: AC | PRN
  Administered 2016-08-24: 3 mL via INTRA_ARTICULAR

## 2016-08-24 MED ORDER — METHYLPREDNISOLONE ACETATE 40 MG/ML IJ SUSP
80.0000 mg | INTRAMUSCULAR | Status: AC | PRN
  Administered 2016-08-24: 80 mg

## 2016-08-24 MED ORDER — LIDOCAINE HCL 1 % IJ SOLN
5.0000 mL | INTRAMUSCULAR | Status: AC | PRN
  Administered 2016-08-24: 5 mL

## 2016-08-24 NOTE — Progress Notes (Deleted)
   Office Visit Note   Patient: Jaime George           Date of Birth: [birth year]           MRN: [id number] Visit Date: 08/24/2016              Requested by: Asencion Noble, MD [address] [place], [place] [address] PCP: Asencion Noble, MD   Assessment & Plan: Visit Diagnoses:  1. Chronic pain of left knee     Plan: ***  Follow-Up Instructions: No Follow-up on file.   Orders:  No orders of the defined types were placed in this encounter.  No orders of the defined types were placed in this encounter.     Procedures: No procedures performed   Clinical Data: No additional findings.   Subjective: No chief complaint on file.   Jaime George is here today for an injection in her left knee. She relates the cortisone injection from last week and she has relief.    Review of Systems   Objective: Vital Signs: There were no vitals taken for this visit.  Physical Exam  Ortho Exam  Specialty Comments:  No specialty comments available.  Imaging: No results found.   PMFS History: Patient Active Problem List   Diagnosis Date Noted  . Muscle weakness (generalized) 01/25/2013  . Pain in joint, shoulder region 01/25/2013  . 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  No family history on file.  Past Surgical History:  Procedure Laterality Date  . KNEE SURGERY    . LAPAROSCOPIC APPENDECTOMY  07/18/2011   Procedure: APPENDECTOMY LAPAROSCOPIC;  Surgeon: Donato Heinz, MD;  Location: [facility];  Service: General;  Laterality: N/A;   Social History   Occupational History  . Not on file.   Social History Main Topics  . Smoking status: Never Smoker  . Smokeless tobacco: Never Used  . Alcohol use No  . Drug use: No  . Sexual activity: Yes    Birth control/ protection: None

## 2016-08-24 NOTE — Progress Notes (Signed)
   Office Visit Note   Patient: Jaime George           Date of Birth: 1965-10-18           MRN: [id number] Visit Date: 08/24/2016              Requested by: Asencion Noble, MD [address] [place],  [address] PCP: Asencion Noble, MD   Assessment & Plan: Visit Diagnoses:  1. Chronic pain of left knee   Osteoarthritis  Plan: cortisone injection left knee  Follow-Up Instructions: Return if symptoms worsen or fail to improve.   Orders:  No orders of the defined types were placed in this encounter.  No orders of the defined types were placed in this encounter.     Procedures: Large Joint Inj Date/Time: 08/24/2016 3:56 PM Performed by: Garald Balding Authorized by: Garald Balding   Consent Given by:  Patient Timeout: prior to procedure the correct patient, procedure, and site was verified   Indications:  Pain and joint swelling Location:  Knee Site:  L knee Prep: patient was prepped and draped in usual sterile fashion   Needle Size:  25 G Needle Length:  1.5 inches Approach:  Anteromedial Ultrasound Guidance: No   Fluoroscopic Guidance: No   Arthrogram: No   Medications:  5 mL lidocaine 1 %; 80 mg methylPREDNISolone acetate 40 MG/ML; 3 mL bupivacaine 0.5 % Aspiration Attempted: No   Patient tolerance:  Patient tolerated the procedure well with no immediate complications     Clinical Data: No additional findings.   Subjective: No chief complaint on file.  Successful cortisone inj right knee 2 weeks ago-wants inj left knee HPI  Review of Systems   Objective: Vital Signs: There were no vitals taken for this visit.  Physical Exam  Ortho Examleft knee without effusion, predominantly med jt pain. Full ext and 105 flexion  Specialty Comments:  No specialty comments available.  Imaging: No results found.   PMFS History: Patient Active Problem List   Diagnosis Date Noted  . Muscle weakness (generalized) 01/25/2013  . Pain in joint,  shoulder region 01/25/2013  . 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  No family history on file.  Past Surgical History:  Procedure Laterality Date  . KNEE SURGERY    . LAPAROSCOPIC APPENDECTOMY  07/18/2011   Procedure: APPENDECTOMY LAPAROSCOPIC;  Surgeon: Donato Heinz, MD;  Location: [facility];  Service: General;  Laterality: N/A;   Social History   Occupational History  . Not on file.   Social History Main Topics  . Smoking status: Never Smoker  . Smokeless tobacco: Never Used  . Alcohol use No  . Drug use: No  . Sexual activity: Yes    Birth control/ protection: None

## 2016-08-25 ENCOUNTER — Encounter (HOSPITAL_COMMUNITY): Payer: Self-pay | Admitting: Physical Therapy

## 2016-08-25 ENCOUNTER — Ambulatory Visit (HOSPITAL_COMMUNITY): Payer: BC Managed Care – PPO | Attending: Orthopaedic Surgery | Admitting: Physical Therapy

## 2016-08-25 DIAGNOSIS — R2689 Other abnormalities of gait and mobility: Secondary | ICD-10-CM | POA: Diagnosis present

## 2016-08-25 DIAGNOSIS — M25562 Pain in left knee: Secondary | ICD-10-CM | POA: Diagnosis present

## 2016-08-25 DIAGNOSIS — G8929 Other chronic pain: Secondary | ICD-10-CM | POA: Insufficient documentation

## 2016-08-25 DIAGNOSIS — M25561 Pain in right knee: Secondary | ICD-10-CM | POA: Insufficient documentation

## 2016-08-25 NOTE — Therapy (Signed)
Jaime George [address] [place], [place], [address] Phone: [phone]   Fax:  [phone]  Physical Therapy Evaluation (one time visit)  Patient Details  Name: Jaime George MRN: [id number] Date of Birth: August 28, 1965 Referring Provider: Joni Fears, MD  Encounter Date: 08/25/2016      PT End of Session - 08/25/16 1737    Visit Number 1   Number of Visits 1   Authorization Type [employer]   Authorization Time Period 08/25/16   PT Start Time 1701  Pt arrived late   PT Stop Time 1731   PT Time Calculation (min) 30 min   Activity Tolerance Patient tolerated treatment well;No increased pain   Behavior During Therapy [facility] for tasks assessed/performed      History reviewed. No pertinent past medical history.  Past Surgical History:  Procedure Laterality Date  . KNEE SURGERY    . LAPAROSCOPIC APPENDECTOMY  07/18/2011   Procedure: APPENDECTOMY LAPAROSCOPIC;  Surgeon: Donato Heinz, MD;  Location: [facility];  Service: General;  Laterality: N/A;    There were no vitals filed for this visit.       Subjective Assessment - 08/25/16 1703    Subjective Pt reports that she has had B knee pain for several years. She saw her PCP who is hoping to delay knee replacement surgery and encouraged her to come to PT to improve her strength, etc. She has also joined and gym and is trying to work on controlling her weight. She had a Lt knee injection yesterday and 2 weeks ago on the Rt.    Pertinent History Rt knee scope x3    Limitations Walking;Other (comment)  stairs    How long can you sit comfortably? unlimited    How long can you stand comfortably? unlimited    How long can you walk comfortably? unlimited but she pushed through the pain   Patient Stated Goals improve pain, strength, and learn what exercises she can perform that will hopefully delay a knee replacement    Currently in Pain? Yes   Pain Score 5    Pain Location Knee   Pain  Orientation Left;Anterior   Pain Descriptors / Indicators Aching;Constant   Pain Type Chronic pain   Pain Radiating Towards none    Pain Onset More than a month ago   Pain Frequency Intermittent   Aggravating Factors  prolonged standing/walking; going up/down stairs   Pain Relieving Factors injections have helped in the past, sitting and resting    Effect of Pain on Daily Activities limited ability to interact with children at home             [facility] PT Assessment - 08/25/16 0001      Assessment   Medical Diagnosis B knee pain    Referring Provider Joni Fears, MD   Onset Date/Surgical Date --  several years ago   Next MD Visit None as of now, but will revisit in June to see if this improves her pain/function     Precautions   Precautions None     Balance Screen   Has the patient fallen in the past 6 months No   Has the patient had a decrease in activity level because of a fear of falling?  No   Is the patient reluctant to leave their home because of a fear of falling?  No     Home Social worker Private residence   Living Arrangements Children;Spouse/significant other  Additional Comments 6 STE     Prior Function   Level of Independence Independent   Vocation Full time employment   Leisure playing with children      Cognition   Overall Cognitive Status Within Functional Limits for tasks assessed     Sensation   Light Touch Appears Intact     ROM / Strength   AROM / PROM / Strength AROM;Strength     AROM   AROM Assessment Site Knee   Right/Left Knee --  B knee flexion AROM to 130 deg      Strength   Strength Assessment Site Hip;Knee;Ankle   Right/Left Hip Right;Left   Right Hip Flexion 5/5   Right Hip Extension 4/5   Right Hip ABduction 4/5   Left Hip Flexion 5/5   Left Hip Extension 4/5   Left Hip ABduction 4/5   Right/Left Knee Right;Left   Right Knee Flexion 4+/5   Left Knee Flexion 4+/5   Right/Left Ankle Right;Left    Right Ankle Dorsiflexion 5/5   Left Ankle Dorsiflexion 5/5     Flexibility   Soft Tissue Assessment /Muscle Length yes   Hamstrings WNL   Quadriceps WNL, Lt quad resistance greater than Rt      Transfers   Five time sit to stand comments  10 sec without UE support.      Ambulation/Gait   Pre-Gait Activities Ascends/descends 4, 6" steps without handrails or noted difficulty, pain reported with ascending on the Lt only      High Level Balance   High Level Balance Comments TUG: 5.3 sec, no AD                           PT Education - 08/25/16 1734    Education provided Yes   Education Details eval findings/POC; reiterated the importance of increasing BLE strength and low impact activity to delay need for knee replacement surgery; reviewed weight machines and cardio equipment in the facility as well as discussed recommended resistance/reps with LE strengthening exercises; encouraged pt to avoid treadmill for cardio and discussed other options; provided office # for any further questions/concerns she may have    Person(s) Educated Patient   Methods Explanation;Demonstration   Comprehension Verbalized understanding          PT Short Term Goals - 08/25/16 1746      PT SHORT TERM GOAL #1   Title Pt will verbalize understanding of therapist recommendations for exercises/goals to focus on at the gym, which will allow her to independently progress her strength/mobility.   Time 1   Period Days   Status Achieved                  Plan - 08/25/16 1738    Clinical Impression Statement Pt is a pleasant 51yo F referred to OPPT concerning chronic bilateral knee pain. She was referred by her PCP with the intentions of developing an exercise program that she can perform at the gym to delay the need for any surgical intervention.  She demonstrates overall good BLE strength, with her primary weakness in the hip extensors and abductors. She performed well on functional  balance and strength testing, well within what is expected for someone her age. Although painful, she is able to successfully ascend/descend steps without assistance of noted unsteadiness. She is eager to follow her referring MD recommendations and has joined a gym.  At this time, she feels comfortable receiving verbal  instruction for her to independently improve her fitness and LE strength at her gym. She does not require skilled PT to address her limitations, and this should improve as she resumes her regular fitness regimen. Therapist reviewed certain activities that she should avoid at this time and discussed specific areas of weakness that would most benefit from attention at the gym. She verbalized appreciation and agreement with this.    Rehab Potential Good   PT Frequency One time visit   PT Treatment/Interventions ADLs/Self Care Home Management;Therapeutic exercise;Therapeutic activities   PT Next Visit Plan one time visit    PT Home Exercise Plan reviewed recommendations for equipment at the gym and other activities that are low impact   Consulted and Agree with Plan of Care Patient      Patient will benefit from skilled therapeutic intervention in order to improve the following deficits and impairments:  Decreased strength, Decreased endurance, Pain, Impaired flexibility, Decreased activity tolerance  Visit Diagnosis: Chronic pain of right knee  Chronic pain of left knee  Other abnormalities of gait and mobility     Problem List Patient Active Problem List   Diagnosis Date Noted  . Muscle weakness (generalized) 01/25/2013  . Pain in joint, shoulder region 01/25/2013  . Arthropathy of shoulder region 01/25/2013   5:49 PM,08/25/16 Elly Modena PT, DPT [facility] [place] [address] [place], [place], [address] Phone: [phone]   Fax:  [phone]  Name: OSIRIS CHARLES MRN:  [id number] Date of Birth: 08/06/1965

## 2016-11-06 ENCOUNTER — Encounter (HOSPITAL_COMMUNITY): Payer: Self-pay | Admitting: *Deleted

## 2016-11-06 ENCOUNTER — Emergency Department (HOSPITAL_COMMUNITY): Payer: BC Managed Care – PPO

## 2016-11-06 ENCOUNTER — Emergency Department (HOSPITAL_COMMUNITY)
Admission: EM | Admit: 2016-11-06 | Discharge: 2016-11-06 | Disposition: A | Discharge status: Home / Self Care | Payer: BC Managed Care – PPO | Attending: Emergency Medicine | Admitting: Emergency Medicine

## 2016-11-06 DIAGNOSIS — R0789 Other chest pain: Secondary | ICD-10-CM | POA: Insufficient documentation

## 2016-11-06 DIAGNOSIS — Z79899 Other long term (current) drug therapy: Secondary | ICD-10-CM | POA: Insufficient documentation

## 2016-11-06 DIAGNOSIS — Z7982 Long term (current) use of aspirin: Secondary | ICD-10-CM | POA: Diagnosis not present

## 2016-11-06 LAB — CBC
HCT: 41.9 % (ref 36.0–46.0)
Hemoglobin: 14.2 g/dL (ref 12.0–15.0)
MCH: 30.5 pg (ref 26.0–34.0)
MCHC: 33.9 g/dL (ref 30.0–36.0)
MCV: 89.9 fL (ref 78.0–100.0)
Platelets: 282 10*3/uL (ref 150–400)
RBC: 4.66 MIL/uL (ref 3.87–5.11)
RDW: 13.1 % (ref 11.5–15.5)
WBC: 7.7 10*3/uL (ref 4.0–10.5)

## 2016-11-06 LAB — BASIC METABOLIC PANEL
Anion gap: 9 (ref 5–15)
BUN: 22 mg/dL — AB (ref 6–20)
CALCIUM: 10.1 mg/dL (ref 8.9–10.3)
CO2: 28 mmol/L (ref 22–32)
Chloride: 104 mmol/L (ref 101–111)
Creatinine, Ser: 0.93 mg/dL (ref 0.44–1.00)
GFR calc Af Amer: >60 mL/min (ref >60)
GLUCOSE: 108 mg/dL — AB (ref 65–99)
Potassium: 3.6 mmol/L (ref 3.5–5.1)
Sodium: 141 mmol/L (ref 135–145)

## 2016-11-06 LAB — DIFFERENTIAL
BASOS PCT: 1 %
Basophils Absolute: 0 10*3/uL (ref 0.0–0.1)
Eosinophils Absolute: 0.5 10*3/uL (ref 0.0–0.7)
Eosinophils Relative: 6 %
LYMPHS PCT: 42 %
Lymphs Abs: 3.3 10*3/uL (ref 0.7–4.0)
MONOS PCT: 10 %
Monocytes Absolute: 0.7 10*3/uL (ref 0.1–1.0)
Neutro Abs: 3.1 10*3/uL (ref 1.7–7.7)
Neutrophils Relative %: 41 %

## 2016-11-06 LAB — I-STAT TROPONIN, ED
TROPONIN I, POC: 0 ng/mL (ref 0.00–0.08)
Troponin i, poc: 0 ng/mL (ref 0.00–0.08)

## 2016-11-06 MED ORDER — NITROGLYCERIN 0.4 MG SL SUBL
0.4000 mg | SUBLINGUAL_TABLET | SUBLINGUAL | Status: DC | PRN
  Administered 2016-11-06: 0.4 mg via SUBLINGUAL
  Filled 2016-11-06: qty 1

## 2016-11-06 MED ORDER — ONDANSETRON HCL 4 MG/2ML IJ SOLN
4.0000 mg | Freq: Once | INTRAMUSCULAR | Status: AC
  Administered 2016-11-06: 4 mg via INTRAVENOUS
  Filled 2016-11-06: qty 2

## 2016-11-06 MED ORDER — PANTOPRAZOLE SODIUM 40 MG PO TBEC: 40.0000 mg | DELAYED_RELEASE_TABLET | Freq: Every day | ORAL | 0 refills | Status: DC

## 2016-11-06 MED ORDER — ASPIRIN 81 MG PO CHEW
324.0000 mg | CHEWABLE_TABLET | Freq: Once | ORAL | Status: AC
  Administered 2016-11-06: 324 mg via ORAL
  Filled 2016-11-06: qty 4

## 2016-11-06 MED ORDER — PANTOPRAZOLE SODIUM 40 MG PO TBEC
40.0000 mg | DELAYED_RELEASE_TABLET | Freq: Once | ORAL | Status: AC
  Administered 2016-11-06: 40 mg via ORAL
  Filled 2016-11-06: qty 1

## 2016-11-06 MED ORDER — GI COCKTAIL ~~LOC~~
30.0000 mL | Freq: Once | ORAL | Status: AC
  Administered 2016-11-06: 30 mL via ORAL
  Filled 2016-11-06: qty 30

## 2016-11-06 NOTE — ED Notes (Signed)
Pt states she has had some relief after medication.

## 2016-11-06 NOTE — ED Provider Notes (Signed)
[facility] DEPT Provider Note   CSN: [id number] Arrival date & time: 11/06/16  0417     History   Chief Complaint Chief Complaint  Patient presents with  . Chest Pain    HPI Jaime George is a 51 y.o. female.  The history is provided by the patient.  Chest Pain    She ate dinner at about 5:30 PM. At about 10:30 PM, she noted a burning feeling in her mid sternal area. She took 3 Tums and went to sleep. She woke up at 2:30 AM with recurrence of the burning pain in her chest. There is no radiation of pain. There is associated nausea but no vomiting. She denies dyspnea or sweats. She took 3 more times without relief. She tried sitting up and walking without relief. Nothing seemed to make the pain better or worse. She rates the pain at 7/10. She is a nonsmoker and there is no history of diabetes, hypertension, hyperlipidemia. There is a family history of coronary artery disease with onset in their 46s but no history of premature coronary atherosclerosis.  History reviewed. No pertinent past medical history.  Patient Active Problem List   Diagnosis Date Noted  . Muscle weakness (generalized) 01/25/2013  . Pain in joint, shoulder region 01/25/2013  . Arthropathy of shoulder region 01/25/2013    Past Surgical History:  Procedure Laterality Date  . KNEE SURGERY    . LAPAROSCOPIC APPENDECTOMY  07/18/2011   Procedure: APPENDECTOMY LAPAROSCOPIC;  Surgeon: Donato Heinz, MD;  Location: [facility];  Service: General;  Laterality: N/A;    OB History    No data available       Home Medications    Prior to Admission medications   Medication Sig Start Date End Date Taking? Authorizing Provider  amphetamine-dextroamphetamine (ADDERALL XR) 10 MG 24 hr capsule Take 10 mg by mouth every morning.    [provider]  aspirin-acetaminophen-caffeine (EXCEDRIN MIGRAINE) [phone] MG per tablet Take 1 tablet by mouth every 6 (six) hours as needed. For migraines    [provider]  cholecalciferol (VITAMIN D) 400 units TABS tablet Take 800 Units by mouth.    [provider]  fish oil-omega-3 fatty acids 1000 MG capsule Take 1 g by mouth daily.    [provider]  ibuprofen (ADVIL,MOTRIN) 200 MG tablet Take 600 mg by mouth every 6 (six) hours as needed. For pain    [provider]  Multiple Vitamin (MULITIVITAMIN WITH MINERALS) TABS Take 1 tablet by mouth daily.    [provider]    Family History History reviewed. No pertinent family history.  Social History Social History  Substance Use Topics  . Smoking status: Never Smoker  . Smokeless tobacco: Never Used  . Alcohol use Yes     Comment: occasionally     Allergies   Codeine   Review of Systems Review of Systems  Cardiovascular: Positive for chest pain.  All other systems reviewed and are negative.    Physical Exam Updated Vital Signs BP (!) 146/78 (BP Location: Right Arm)   Pulse 74   Resp 17   Ht 5' 6.5" (1.689 m)   Wt 88.5 kg (195 lb)   LMP 11/03/2016   SpO2 99%   BMI 31.00 kg/m   Physical Exam  Nursing note and vitals reviewed.  51 year old female, resting comfortably and in no acute distress. Vital signs are significant for hypertension. Oxygen saturation is 99%, which is normal. Head is normocephalic and  atraumatic. PERRLA, EOMI. Oropharynx is clear. Neck is nontender and supple without adenopathy or JVD. Back is nontender and there is no CVA tenderness. Lungs are clear without rales, wheezes, or rhonchi. Chest is nontender. Heart has regular rate and rhythm without murmur. Abdomen is soft, flat, nontender without masses or hepatosplenomegaly and peristalsis is normoactive. Extremities have no cyanosis or edema, full range of motion is present. Skin is warm and dry without rash. Neurologic: Mental status is normal, cranial nerves are intact, there are no motor or sensory deficits.  ED Treatments / Results  Labs (all labs  ordered are listed, but only abnormal results are displayed) Labs Reviewed  BASIC METABOLIC PANEL - Abnormal; Notable for the following:       Result Value   Glucose, Bld 108 (*)    BUN 22 (*)    All other components within normal limits  CBC  DIFFERENTIAL  I-STAT TROPOININ, ED    EKG  EKG Interpretation  Date/Time:  Sunday Nov 06 2016 04:29:45 EDT Ventricular Rate:  76 PR Interval:    QRS Duration: 87 QT Interval:  384 QTC Calculation: 432 R Axis:   76 Text Interpretation:  Sinus rhythm Borderline repolarization abnormality No old tracing to compare Confirmed by Delora Fuel ([id number]) on 11/06/2016 4:34:49 AM       Radiology Dg Chest 2 View  Result Date: 11/06/2016 CLINICAL DATA:  Centralized chest pain this morning. EXAM: CHEST  2 VIEW COMPARISON:  None. FINDINGS: Cardiomediastinal silhouette is normal. No pleural effusions or focal consolidations. Trachea projects midline and there is no pneumothorax. Soft tissue planes and included osseous structures are non-suspicious. Postsurgical changes distal RIGHT clavicle. IMPRESSION: Normal chest. Electronically Signed   By: Elon Alas M.D.   On: 11/06/2016 05:05    Procedures Procedures (including critical care time)  Medications Ordered in ED Medications  nitroGLYCERIN (NITROSTAT) SL tablet 0.4 mg (0.4 mg Sublingual Given 11/06/16 0512)  pantoprazole (PROTONIX) EC tablet 40 mg (not administered)  aspirin chewable tablet 324 mg (324 mg Oral Given 11/06/16 0502)  ondansetron (ZOFRAN) injection 4 mg (4 mg Intravenous Given 11/06/16 0503)  gi cocktail (Maalox,Lidocaine,Donnatal) (30 mLs Oral Given 11/06/16 6237)     Initial Impression / Assessment and Plan / ED Course  I have reviewed the triage vital signs and the nursing notes.  Pertinent labs & imaging results that were available during my care of the patient were reviewed by me and considered in my medical decision making (see chart for details).  Chest pain which  seems quite atypical in nature. She is no significant cardiac risk factors. Heart score equals 2 which places her at extremely low risk. Old records are reviewed, and I see no relevant past visits. She given a dose of aspirin and will give a trial of nitroglycerin.  Initial workup is completely normal. Chest x-ray and EKG are unremarkable, troponin is 0. She continues to have burning in her chest. She will be given GI cocktail and a dose of pantoprazole. She'll be held in the ED for a delta troponin. Case is signed out to Dr. Jeanell Sparrow.  Final Clinical Impressions(s) / ED Diagnoses   Final diagnoses:  Atypical chest pain    New Prescriptions New Prescriptions   No medications on file     Delora Fuel, MD 62/83/15 [phone]

## 2016-11-06 NOTE — ED Triage Notes (Signed)
Pt c/o chest pain that started last night; pt describes the pain as a burning sensation; pt states she has taken tums with no relief

## 2016-11-06 NOTE — Discharge Instructions (Signed)
Return if symptoms are getting worse. °

## 2016-12-21 ENCOUNTER — Encounter (INDEPENDENT_AMBULATORY_CARE_PROVIDER_SITE_OTHER): Payer: Self-pay | Admitting: Orthopaedic Surgery

## 2016-12-21 ENCOUNTER — Ambulatory Visit (INDEPENDENT_AMBULATORY_CARE_PROVIDER_SITE_OTHER): Payer: BC Managed Care – PPO | Admitting: Orthopaedic Surgery

## 2016-12-21 VITALS — Ht 66.0 in | Wt 214.0 lb

## 2016-12-21 DIAGNOSIS — M25562 Pain in left knee: Secondary | ICD-10-CM | POA: Diagnosis not present

## 2016-12-21 DIAGNOSIS — G8929 Other chronic pain: Secondary | ICD-10-CM

## 2016-12-21 MED ORDER — METHYLPREDNISOLONE ACETATE 40 MG/ML IJ SUSP
80.0000 mg | INTRAMUSCULAR | Status: AC | PRN
  Administered 2016-12-21: 80 mg

## 2016-12-21 MED ORDER — BUPIVACAINE HCL 0.5 % IJ SOLN
3.0000 mL | INTRAMUSCULAR | Status: AC | PRN
  Administered 2016-12-21: 3 mL via INTRA_ARTICULAR

## 2016-12-21 MED ORDER — LIDOCAINE HCL 1 % IJ SOLN
5.0000 mL | INTRAMUSCULAR | Status: AC | PRN
  Administered 2016-12-21: 5 mL

## 2016-12-21 NOTE — Progress Notes (Signed)
Office Visit Note   Patient: Jaime George           Date of Birth: 1965-11-27           MRN: [id number] Visit Date: 12/21/2016              Requested by: Asencion Noble, MD [address] [place], [place] [address] PCP: Asencion Noble, MD   Assessment & Plan: Visit Diagnoses:  1. Chronic pain of left knee   Osteoarthritis left knee  Plan: Cortisone injection left knee. Follow-up as needed  Follow-Up Instructions: Return if symptoms worsen or fail to improve.   Orders:  No orders of the defined types were placed in this encounter.  No orders of the defined types were placed in this encounter.     Procedures: Large Joint Inj Date/Time: 12/21/2016 3:41 PM Performed by: Garald Balding Authorized by: Garald Balding   Consent Given by:  Patient Timeout: prior to procedure the correct patient, procedure, and site was verified   Indications:  Pain and joint swelling Location:  Knee Site:  L knee Prep: patient was prepped and draped in usual sterile fashion   Needle Size:  25 G Needle Length:  1.5 inches Approach:  Anteromedial Ultrasound Guidance: No   Fluoroscopic Guidance: No   Arthrogram: No   Medications:  5 mL lidocaine 1 %; 80 mg methylPREDNISolone acetate 40 MG/ML; 3 mL bupivacaine 0.5 % Aspiration Attempted: No   Patient tolerance:  Patient tolerated the procedure well with no immediate complications     Clinical Data: No additional findings.   Subjective: Chief Complaint  Patient presents with  . Left Knee - Pain    Pt received cortisone injection 2/28 in L knee and 3/14/ in the R knee. Tofday she wants another inj in the L knee which is worse.  Jaime George has been followed for the osteoarthritis in both knees. She's had prior cortisone injections within the past 4-5 months with good relief. She's having a little bit more trouble now on the left knee with an upcoming vacation and would like to have a cortisone injection. She denies any injury or  trauma. She may have some mild swelling  HPI  Review of Systems   Objective: Vital Signs: Ht 5\' 6"  (1.676 m)   Wt 214 lb (97.1 kg)   BMI 34.54 kg/m   Physical Exam  Ortho Exam Left knee with medial joint tenderness. No effusion. Mild medial joint pain without crepitation. Mild patellar crepitation. No instability. No calf pain. Walks without a limp. Straight leg raise negative.  Range of motion of either hip. Specialty Comments:  No specialty comments available.  Imaging: No results found.   PMFS History: Patient Active Problem List   Diagnosis Date Noted  . Muscle weakness (generalized) 01/25/2013  . Pain in joint, shoulder region 01/25/2013  . 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  No family history on file.  Past Surgical History:  Procedure Laterality Date  . KNEE SURGERY    . LAPAROSCOPIC APPENDECTOMY  07/18/2011   Procedure: APPENDECTOMY LAPAROSCOPIC;  Surgeon: Donato Heinz, MD;  Location: [facility];  Service: General;  Laterality: N/A;   Social History   Occupational History  . Not on file.   Social History Main Topics  . Smoking status: Never Smoker  . Smokeless tobacco: Never Used  . Alcohol use Yes     Comment: occasionally  . Drug use: No  .  Sexual activity: Yes    Birth control/ protection: None     Garald Balding, MD   Note - This record has been created using [employer].  Chart creation errors have been sought, but may not always  have been located. Such creation errors do not reflect on  the standard of medical care.

## 2017-01-18 ENCOUNTER — Other Ambulatory Visit: Payer: Self-pay | Admitting: Internal Medicine

## 2017-01-18 DIAGNOSIS — Z1231 Encounter for screening mammogram for malignant neoplasm of breast: Secondary | ICD-10-CM

## 2017-01-25 ENCOUNTER — Ambulatory Visit
Admission: RE | Admit: 2017-01-25 | Discharge: 2017-01-25 | Disposition: A | Discharge status: Home / Self Care | Payer: BC Managed Care – PPO | Source: Ambulatory Visit | Attending: Internal Medicine | Admitting: Internal Medicine

## 2017-01-25 DIAGNOSIS — Z1231 Encounter for screening mammogram for malignant neoplasm of breast: Secondary | ICD-10-CM

## 2017-08-13 ENCOUNTER — Other Ambulatory Visit: Payer: Self-pay

## 2017-08-13 ENCOUNTER — Encounter (HOSPITAL_COMMUNITY): Payer: Self-pay | Admitting: Urgent Care

## 2017-08-13 ENCOUNTER — Ambulatory Visit (HOSPITAL_COMMUNITY)
Admission: EM | Admit: 2017-08-13 | Discharge: 2017-08-13 | Disposition: A | Discharge status: Home / Self Care | Payer: BC Managed Care – PPO

## 2017-08-13 DIAGNOSIS — J3489 Other specified disorders of nose and nasal sinuses: Secondary | ICD-10-CM

## 2017-08-13 DIAGNOSIS — H938X3 Other specified disorders of ear, bilateral: Secondary | ICD-10-CM

## 2017-08-13 DIAGNOSIS — R059 Cough, unspecified: Secondary | ICD-10-CM

## 2017-08-13 DIAGNOSIS — R05 Cough: Secondary | ICD-10-CM

## 2017-08-13 DIAGNOSIS — J0140 Acute pansinusitis, unspecified: Secondary | ICD-10-CM | POA: Diagnosis not present

## 2017-08-13 MED ORDER — AMOXICILLIN 500 MG PO CAPS: 500.0000 mg | ORAL_CAPSULE | Freq: Three times a day (TID) | ORAL | 0 refills | Status: DC

## 2017-08-13 MED ORDER — PSEUDOEPHEDRINE HCL ER 120 MG PO TB12: 120.0000 mg | ORAL_TABLET | Freq: Two times a day (BID) | ORAL | 3 refills | Status: DC

## 2017-08-13 NOTE — ED Triage Notes (Signed)
Headaches, cough, facial pressure

## 2017-08-13 NOTE — Discharge Instructions (Signed)
Hydrate well with at least 2 liters (1 gallon) of water daily. You may take 500mg  Tylenol with ibuprofen 400-600mg  every 6 hours for pain and inflammation. Take Sudafed 120mg  every 12 hours for congestion, post-nasal drainage.

## 2017-08-13 NOTE — ED Provider Notes (Addendum)
  MRN: [id number] DOB: 02-23-1966  Subjective:   Jaime George is a 52 y.o. female presenting for 2 week history of sinus pressure/pain. Has had sinus headaches, productive cough (improving), bilateral ear pain/fullness, sore throat from coughing, nausea without vomiting, decreased appetite. Has tried several otc medications without any relief. Denies chest pain, shob, wheezing, abdominal pain. Denies history of asthma. Usually has allergies in the spring. Denies smoking cigarettes.  Jaime George is not currently taking any medications and is allergic to codeine.  Jaime George has allergies. Also  has a past surgical history that includes Knee surgery and laparoscopic appendectomy (07/18/2011).    Objective:   Vitals: BP 137/76 (BP Location: Left Arm)   Pulse 77   Temp 97.9 F (36.6 C) (Oral)   LMP 08/11/2017 (Exact Date)   SpO2 99%   Physical Exam  Constitutional: She is oriented to person, place, and time. She appears well-developed and well-nourished.  HENT:  Mouth/Throat: Oropharynx is clear and moist.  TM's without erythema. Bilateral maxillary and frontal sinus tenderness.  Eyes: Right eye exhibits no discharge. Left eye exhibits no discharge.  Cardiovascular: Normal rate, regular rhythm and intact distal pulses. Exam reveals no gallop and no friction rub.  No murmur heard. Pulmonary/Chest: No respiratory distress. She has no wheezes. She has no rales.  Neurological: She is alert and oriented to person, place, and time.  Skin: Skin is warm and dry.  Psychiatric: She has a normal mood and affect.   Assessment and Plan :   Acute non-recurrent pansinusitis  Sinus pain  Ear fullness, bilateral  Cough  Start amoxicillin for sinusitis. Use Sudafed, hydrate well. Return-to-clinic precautions discussed, patient verbalized understanding.    Jaynee Eagles, PA-C 08/13/17 1438    Jaynee Eagles, PA-C 08/13/17 1439

## 2017-12-26 ENCOUNTER — Other Ambulatory Visit: Payer: Self-pay | Admitting: Internal Medicine

## 2017-12-26 DIAGNOSIS — Z1231 Encounter for screening mammogram for malignant neoplasm of breast: Secondary | ICD-10-CM

## 2018-01-29 ENCOUNTER — Ambulatory Visit
Admission: RE | Admit: 2018-01-29 | Discharge: 2018-01-29 | Disposition: A | Discharge status: Home / Self Care | Payer: BC Managed Care – PPO | Source: Ambulatory Visit | Attending: Internal Medicine | Admitting: Internal Medicine

## 2018-01-29 DIAGNOSIS — Z1231 Encounter for screening mammogram for malignant neoplasm of breast: Secondary | ICD-10-CM

## 2018-04-18 ENCOUNTER — Encounter (INDEPENDENT_AMBULATORY_CARE_PROVIDER_SITE_OTHER): Payer: Self-pay | Admitting: Orthopaedic Surgery

## 2018-04-18 ENCOUNTER — Ambulatory Visit (INDEPENDENT_AMBULATORY_CARE_PROVIDER_SITE_OTHER): Payer: Self-pay

## 2018-04-18 ENCOUNTER — Ambulatory Visit (INDEPENDENT_AMBULATORY_CARE_PROVIDER_SITE_OTHER): Payer: BC Managed Care – PPO | Admitting: Orthopaedic Surgery

## 2018-04-18 VITALS — BP 121/88 | HR 81 | Ht 67.0 in | Wt 195.0 lb

## 2018-04-18 DIAGNOSIS — M1712 Unilateral primary osteoarthritis, left knee: Secondary | ICD-10-CM | POA: Diagnosis not present

## 2018-04-18 DIAGNOSIS — M25562 Pain in left knee: Secondary | ICD-10-CM | POA: Diagnosis not present

## 2018-04-18 DIAGNOSIS — G8929 Other chronic pain: Secondary | ICD-10-CM | POA: Diagnosis not present

## 2018-04-18 MED ORDER — METHYLPREDNISOLONE ACETATE 40 MG/ML IJ SUSP
80.0000 mg | INTRAMUSCULAR | Status: AC | PRN
  Administered 2018-04-18: 80 mg

## 2018-04-18 MED ORDER — LIDOCAINE HCL 1 % IJ SOLN
2.0000 mL | INTRAMUSCULAR | Status: AC | PRN
  Administered 2018-04-18: 2 mL

## 2018-04-18 MED ORDER — BUPIVACAINE HCL 0.5 % IJ SOLN
2.0000 mL | INTRAMUSCULAR | Status: AC | PRN
  Administered 2018-04-18: 2 mL via INTRA_ARTICULAR

## 2018-04-18 NOTE — Progress Notes (Signed)
Office Visit Note   Patient: Jaime George           Date of Birth: 24-Jun-1965           MRN: [id number] Visit Date: 04/18/2018              Requested by: Asencion Noble, MD [address] [place], [place] [address] PCP: Asencion Noble, MD   Assessment & Plan: Visit Diagnoses:  1. Chronic pain of left knee   2. Unilateral primary osteoarthritis, left knee     Plan: Repeat films demonstrate significant tricompartmental degenerative changes.  Will inject the left knee with cortisone and monitor response.  Have had a long discussion regarding treatment options including knee replacement  Follow-Up Instructions: Return if symptoms worsen or fail to improve.   Orders:  Orders Placed This Encounter  Procedures  . Large Joint Inj: L knee  . XR KNEE 3 VIEW LEFT   No orders of the defined types were placed in this encounter.     Procedures: Large Joint Inj: L knee on 04/18/2018 12:35 PM Indications: pain and diagnostic evaluation Details: 25 G 1.5 in needle, anteromedial approach  Arthrogram: No  Medications: 2 mL lidocaine 1 %; 2 mL bupivacaine 0.5 %; 80 mg methylPREDNISolone acetate 40 MG/ML Procedure, treatment alternatives, risks and benefits explained, specific risks discussed. Consent was given by the patient. Patient was prepped and draped in the usual sterile fashion.       Clinical Data: No additional findings.   Subjective: Chief Complaint  Patient presents with  . Follow-up    LKNEE PAIN 6 MO  HAD SHOT LESS THAN 1 YR AGO, WOULD LIKE INJECTION, HAS POPPING, GRINDING,MEDIAL PAIN  TRIES TO GIVE AWAY, USING ICE IBUPROFEN  [name] established diagnosis of osteoarthritis left knee.  She has had prior cortisone and Visco supplementation.  Over the past several months she has had increasing pain particularly along the medial compartment associated with stiffness and soreness.  She has had some swelling.  She is reached a point where she is having difficulty  bearing weight and exercising.  Last Visco supplementation was in 2016  HPI  Review of Systems  Constitutional: Negative for fatigue and fever.  HENT: Negative for ear pain.   Eyes: Negative for pain.  Respiratory: Negative for cough and shortness of breath.   Cardiovascular: Negative for leg swelling.  Gastrointestinal: Negative for constipation and diarrhea.  Genitourinary: Negative for difficulty urinating.  Musculoskeletal: Negative for back pain and neck pain.  Skin: Negative for rash.  Allergic/Immunologic: Negative for food allergies.  Neurological: Positive for weakness. Negative for numbness.  Hematological: Does not bruise/bleed easily.  Psychiatric/Behavioral: Negative for sleep disturbance.     Objective: Vital Signs: BP 121/88 (BP Location: Left Arm, Patient Position: Sitting, Cuff Size: Normal)   Pulse 81   Ht 5\' 7"  (1.702 m)   Wt 195 lb (88.5 kg)   BMI 30.54 kg/m   Physical Exam  Constitutional: She is oriented to person, place, and time. She appears well-developed and well-nourished.  HENT:  Mouth/Throat: Oropharynx is clear and moist.  Eyes: Pupils are equal, round, and reactive to light. EOM are normal.  Pulmonary/Chest: Effort normal.  Neurological: She is alert and oriented to person, place, and time.  Skin: Skin is warm and dry.  Psychiatric: She has a normal mood and affect. Her behavior is normal.    Ortho Exam awake alert and oriented x3.  Comfortable sitting.  Left knee exam without effusion.  There is increased varus with weightbearing and diffuse moderate medial joint pain.  Some patellar crepitation.  No instability.  Full extension and over 100 degrees of flexion.  No calf pain.  No popliteal discomfort.  Neurovascular exam intact  Specialty Comments:  No specialty comments available.  Imaging: Xr Knee 3 View Left  Result Date: 04/18/2018 Films of the left knee were obtained in 3 projections standing.  There is significant decrease in the  medial joint space associated with peripheral osteophytes and subchondral sclerosis.  There are even some subchondral cysts on the right side.  Approximately 2 to 3 degrees of varus.  Degenerative changes are also identified at the patellofemoral lateral compartments.  No acute change.  No ectopic calcification    PMFS History: Patient Active Problem List   Diagnosis Date Noted  . Muscle weakness (generalized) 01/25/2013  . Pain in joint, shoulder region 01/25/2013  . Arthropathy of shoulder region 01/25/2013   History reviewed. No pertinent past medical history.  Family History  Problem Relation Age of Onset  . Breast cancer Neg Hx     Past Surgical History:  Procedure Laterality Date  . KNEE SURGERY    . LAPAROSCOPIC APPENDECTOMY  07/18/2011   Procedure: APPENDECTOMY LAPAROSCOPIC;  Surgeon: Donato Heinz, MD;  Location: [facility];  Service: General;  Laterality: N/A;   Social History   Occupational History  . Not on file  Tobacco Use  . Smoking status: Never Smoker  . Smokeless tobacco: Never Used  Substance and Sexual Activity  . Alcohol use: Yes    Comment: occasionally  . Drug use: No  . Sexual activity: Yes    Birth control/protection: None

## 2018-04-25 ENCOUNTER — Ambulatory Visit (INDEPENDENT_AMBULATORY_CARE_PROVIDER_SITE_OTHER): Payer: BC Managed Care – PPO | Admitting: Orthopaedic Surgery

## 2018-04-25 ENCOUNTER — Encounter (INDEPENDENT_AMBULATORY_CARE_PROVIDER_SITE_OTHER): Payer: Self-pay | Admitting: Orthopaedic Surgery

## 2018-04-25 DIAGNOSIS — M1712 Unilateral primary osteoarthritis, left knee: Secondary | ICD-10-CM

## 2018-04-25 MED ORDER — HYLAN G-F 20 16 MG/2ML IX SOSY
16.0000 mg | PREFILLED_SYRINGE | INTRA_ARTICULAR | Status: AC | PRN
  Administered 2018-04-25: 16 mg via INTRA_ARTICULAR

## 2018-04-25 NOTE — Progress Notes (Signed)
   Office Visit Note   Patient: Jaime George           Date of Birth: Mar 17, 1966           MRN: [id number] Visit Date: 04/25/2018              Requested by: Asencion Noble, MD [address] [place], [place] [address] PCP: Asencion Noble, MD   Assessment & Plan: Visit Diagnoses:  1. Unilateral primary osteoarthritis, left knee     Plan: Has been approved for Visco supplementation.  We will start Synvisc injections today and return weekly for the next 2 weeks to complete the series  Follow-Up Instructions: Return in about 1 week (around 05/02/2018).   Orders:  No orders of the defined types were placed in this encounter.  No orders of the defined types were placed in this encounter.     Procedures: Large Joint Inj: L knee on 04/25/2018 2:59 PM Indications: pain and joint swelling Details: 25 G 1.5 in needle, anteromedial approach  Arthrogram: No  Medications: 16 mg Hylan 16 MG/2ML Outcome: tolerated well, no immediate complications Procedure, treatment alternatives, risks and benefits explained, specific risks discussed. Consent was given by the patient. Immediately prior to procedure a time out was called to verify the correct patient, procedure, equipment, support staff and site/side marked as required. Patient was prepped and draped in the usual sterile fashion.       Clinical Data: No additional findings.   Subjective: No chief complaint on file.   HPI  Review of Systems   Objective: Vital Signs: There were no vitals taken for this visit.  Physical Exam  Ortho Exam left knee was not hot red warm or swollen.  Pain mostly along the medial compartment.  Neurovascularly intact distally  Specialty Comments:  No specialty comments available.  Imaging: No results found.   PMFS History: Patient Active Problem List   Diagnosis Date Noted  . Unilateral primary osteoarthritis, left knee 04/25/2018  . Muscle weakness (generalized) 01/25/2013  . Pain  in joint, shoulder region 01/25/2013  . Arthropathy of shoulder region 01/25/2013   History reviewed. No pertinent past medical history.  Family History  Problem Relation Age of Onset  . Breast cancer Neg Hx     Past Surgical History:  Procedure Laterality Date  . KNEE SURGERY    . LAPAROSCOPIC APPENDECTOMY  07/18/2011   Procedure: APPENDECTOMY LAPAROSCOPIC;  Surgeon: Donato Heinz, MD;  Location: [facility];  Service: General;  Laterality: N/A;   Social History   Occupational History  . Not on file  Tobacco Use  . Smoking status: Never Smoker  . Smokeless tobacco: Never Used  Substance and Sexual Activity  . Alcohol use: Yes    Comment: occasionally  . Drug use: No  . Sexual activity: Yes    Birth control/protection: None     Garald Balding, MD   Note - This record has been created using [employer].  Chart creation errors have been sought, but may not always  have been located. Such creation errors do not reflect on  the standard of medical care.

## 2018-05-02 ENCOUNTER — Ambulatory Visit (INDEPENDENT_AMBULATORY_CARE_PROVIDER_SITE_OTHER): Payer: BC Managed Care – PPO | Admitting: Orthopaedic Surgery

## 2018-05-02 ENCOUNTER — Encounter (INDEPENDENT_AMBULATORY_CARE_PROVIDER_SITE_OTHER): Payer: Self-pay | Admitting: Orthopaedic Surgery

## 2018-05-02 DIAGNOSIS — M1712 Unilateral primary osteoarthritis, left knee: Secondary | ICD-10-CM | POA: Diagnosis not present

## 2018-05-02 MED ORDER — HYLAN G-F 20 16 MG/2ML IX SOSY
16.0000 mg | PREFILLED_SYRINGE | INTRA_ARTICULAR | Status: AC | PRN
  Administered 2018-05-02: 16 mg via INTRA_ARTICULAR

## 2018-05-02 NOTE — Progress Notes (Signed)
   Office Visit Note   Patient: Jaime George           Date of Birth: February 16, 1966           MRN: [id number] Visit Date: 05/02/2018              Requested by: Asencion Noble, MD [address] [place], [place] [address] PCP: Asencion Noble, MD   Assessment & Plan: Visit Diagnoses:  1. Unilateral primary osteoarthritis, left knee     Plan: Second Synvisc injection left knee.  Return in 1 week for third and final injection  Follow-Up Instructions: Return in about 1 week (around 05/09/2018).   Orders:  No orders of the defined types were placed in this encounter.  No orders of the defined types were placed in this encounter.     Procedures: Large Joint Inj: L knee on 05/02/2018 3:02 PM Indications: pain and joint swelling Details: 25 G 1.5 in needle, anteromedial approach  Arthrogram: No  Medications: 16 mg Hylan 16 MG/2ML Outcome: tolerated well, no immediate complications Procedure, treatment alternatives, risks and benefits explained, specific risks discussed. Consent was given by the patient. Immediately prior to procedure a time out was called to verify the correct patient, procedure, equipment, support staff and site/side marked as required. Patient was prepped and draped in the usual sterile fashion.       Clinical Data: No additional findings.   Subjective: No chief complaint on file.   HPI  Review of Systems   Objective: Vital Signs: There were no vitals taken for this visit.  Physical Exam  Ortho Exam knee not hot red or swollen.  No joint pain.  Specialty Comments:  No specialty comments available.  Imaging: No results found.   PMFS History: Patient Active Problem List   Diagnosis Date Noted  . Unilateral primary osteoarthritis, left knee 04/25/2018  . Muscle weakness (generalized) 01/25/2013  . Pain in joint, shoulder region 01/25/2013  . Arthropathy of shoulder region 01/25/2013   History reviewed. No pertinent past medical  history.  Family History  Problem Relation Age of Onset  . Breast cancer Neg Hx     Past Surgical History:  Procedure Laterality Date  . KNEE SURGERY    . LAPAROSCOPIC APPENDECTOMY  07/18/2011   Procedure: APPENDECTOMY LAPAROSCOPIC;  Surgeon: Donato Heinz, MD;  Location: [facility];  Service: General;  Laterality: N/A;   Social History   Occupational History  . Not on file  Tobacco Use  . Smoking status: Never Smoker  . Smokeless tobacco: Never Used  Substance and Sexual Activity  . Alcohol use: Yes    Comment: occasionally  . Drug use: No  . Sexual activity: Yes    Birth control/protection: None     Garald Balding, MD   Note - This record has been created using [employer].  Chart creation errors have been sought, but may not always  have been located. Such creation errors do not reflect on  the standard of medical care.

## 2018-05-09 ENCOUNTER — Encounter (INDEPENDENT_AMBULATORY_CARE_PROVIDER_SITE_OTHER): Payer: Self-pay | Admitting: Orthopaedic Surgery

## 2018-05-09 ENCOUNTER — Ambulatory Visit (INDEPENDENT_AMBULATORY_CARE_PROVIDER_SITE_OTHER): Payer: BC Managed Care – PPO | Admitting: Orthopaedic Surgery

## 2018-05-09 VITALS — BP 119/78 | HR 89 | Ht 67.0 in | Wt 195.0 lb

## 2018-05-09 DIAGNOSIS — M1712 Unilateral primary osteoarthritis, left knee: Secondary | ICD-10-CM | POA: Diagnosis not present

## 2018-05-09 MED ORDER — HYLAN G-F 20 16 MG/2ML IX SOSY
16.0000 mg | PREFILLED_SYRINGE | INTRA_ARTICULAR | Status: AC | PRN
  Administered 2018-05-09: 16 mg via INTRA_ARTICULAR

## 2018-05-09 NOTE — Progress Notes (Signed)
   Office Visit Note   Patient: Jaime George           Date of Birth: 10/28/1965           MRN: [id number] Visit Date: 05/09/2018              Requested by: Asencion Noble, MD [address] [place], [place] [address] PCP: Asencion Noble, MD   Assessment & Plan: Visit Diagnoses:  1. Unilateral primary osteoarthritis, left knee     Plan: Third Synvisc injection left knee.  Return as needed  Follow-Up Instructions: Return if symptoms worsen or fail to improve.   Orders:  Orders Placed This Encounter  Procedures  . Large Joint Inj: L knee   No orders of the defined types were placed in this encounter.     Procedures: Large Joint Inj: L knee on 05/09/2018 9:25 AM Indications: pain and joint swelling Details: 25 G 1.5 in needle, anteromedial approach  Arthrogram: No  Medications: 16 mg Hylan 16 MG/2ML Outcome: tolerated well, no immediate complications Procedure, treatment alternatives, risks and benefits explained, specific risks discussed. Consent was given by the patient. Immediately prior to procedure a time out was called to verify the correct patient, procedure, equipment, support staff and site/side marked as required. Patient was prepped and draped in the usual sterile fashion.       Clinical Data: No additional findings.   Subjective: Chief Complaint  Patient presents with  . Left Knee - Follow-up, Pain    Synvisc Injection #3  Patient returns for Synvisc #3 injection.  No problems related to the prior to injections.  Jaime George is not sure it is made much of a difference  HPI  Review of Systems   Objective: Vital Signs: BP 119/78   Pulse 89   Ht 5\' 7"  (1.702 m)   Wt 195 lb (88.5 kg)   BMI 30.54 kg/m   Physical Exam  Ortho Exam no problems related to the prior Visco injections left knee.  Knee was not hot warm red or effused.  Predominately medial joint pain Specialty Comments:  No specialty comments available.  Imaging: No results  found.   PMFS History: Patient Active Problem List   Diagnosis Date Noted  . Unilateral primary osteoarthritis, left knee 04/25/2018  . Muscle weakness (generalized) 01/25/2013  . Pain in joint, shoulder region 01/25/2013  . Arthropathy of shoulder region 01/25/2013   History reviewed. No pertinent past medical history.  Family History  Problem Relation Age of Onset  . Breast cancer Neg Hx     Past Surgical History:  Procedure Laterality Date  . KNEE SURGERY    . LAPAROSCOPIC APPENDECTOMY  07/18/2011   Procedure: APPENDECTOMY LAPAROSCOPIC;  Surgeon: Donato Heinz, MD;  Location: [facility];  Service: General;  Laterality: N/A;   Social History   Occupational History  . Not on file  Tobacco Use  . Smoking status: Never Smoker  . Smokeless tobacco: Never Used  Substance and Sexual Activity  . Alcohol use: Yes    Comment: occasionally  . Drug use: No  . Sexual activity: Yes    Birth control/protection: None

## 2018-10-01 ENCOUNTER — Other Ambulatory Visit: Payer: Self-pay

## 2018-10-01 ENCOUNTER — Encounter (INDEPENDENT_AMBULATORY_CARE_PROVIDER_SITE_OTHER): Payer: Self-pay | Admitting: Internal Medicine

## 2018-10-01 ENCOUNTER — Ambulatory Visit (INDEPENDENT_AMBULATORY_CARE_PROVIDER_SITE_OTHER): Payer: BC Managed Care – PPO | Admitting: Internal Medicine

## 2018-10-01 VITALS — BP 141/81 | HR 88 | Temp 98.1°F | Ht 66.5 in | Wt 204.9 lb

## 2018-10-01 DIAGNOSIS — K219 Gastro-esophageal reflux disease without esophagitis: Secondary | ICD-10-CM | POA: Diagnosis not present

## 2018-10-01 DIAGNOSIS — Z1211 Encounter for screening for malignant neoplasm of colon: Secondary | ICD-10-CM | POA: Diagnosis not present

## 2018-10-01 MED ORDER — SUCRALFATE 1 G PO TABS: 1.0000 g | ORAL_TABLET | Freq: Three times a day (TID) | ORAL | 2 refills | Status: AC

## 2018-10-01 NOTE — Progress Notes (Signed)
   Subjective:    Patient ID: Jaime George, female    DOB: 1966-04-22, 53 y.o.   MRN: [id number]  HPI  Referred by Dr. Willey Blade for GERD/Screening colonosocopy. She takes Protonix BID for GERD. One month ago,she says it was so bad, she thought she was having a heart attack. Has drank 2 bottles of Mylanta for the GERD. She has changed her diet. She is only eating 1200 calories a day. No fried foods, no peanut butter. Does not eat after 630pm. Eating a lot of fiber, and low sugar. Has lost  6 pounds since changing her diet. Had not been taken an excessive amt of the Excedrin migraine. No NSAIDs.She says her GERD really is not any better.  Seen in the ED about a year ago for chest pain.   Review of Systems Past Medical History:  Diagnosis Date  . GERD (gastroesophageal reflux disease)     Past Surgical History:  Procedure Laterality Date  . KNEE SURGERY    . LAPAROSCOPIC APPENDECTOMY  07/18/2011   Procedure: APPENDECTOMY LAPAROSCOPIC;  Surgeon: Donato Heinz, MD;  Location: [facility];  Service: General;  Laterality: N/A;    Allergies  Allergen Reactions  . Codeine Nausea And Vomiting  . Other     Steri strips: blisters    Current Outpatient Medications on File Prior to Visit  Medication Sig Dispense Refill  . aspirin-acetaminophen-caffeine (EXCEDRIN MIGRAINE) 250-250-65 MG per tablet Take 1 tablet by mouth every 6 (six) hours as needed. For migraines    . pantoprazole (PROTONIX) 40 MG tablet Take 1 tablet (40 mg total) by mouth daily. (Patient taking differently: Take 40 mg by mouth 2 (two) times daily before a meal. ) 30 tablet 0   No current facility-administered medications on file prior to visit.         Objective:   Physical Exam Blood pressure (!) 141/81, pulse 88, temperature 98.1 F (36.7 C), height 5' 6.5" (1.689 m), weight 204 lb 14.4 oz (92.9 kg). Alert and oriented. Skin warm and dry. Oral mucosa is moist.   . Sclera anicteric, conjunctivae is pink. Thyroid not  enlarged. No cervical lymphadenopathy. Lungs clear. Heart regular rate and rhythm.  Abdomen is soft. Bowel sounds are positive. No hepatomegaly. No abdominal masses felt. No tenderness.  No edema to lower extremities.          Assessment & Plan:  GERD. Continue present PPI. GERD diet given to patient. Elevate HOB. Screening colonoscopy.

## 2018-10-01 NOTE — Patient Instructions (Addendum)
EGD/Colonoscopy. The risks of bleeding, perforation and infection were reviewed with patient.  Gastroesophageal Reflux Disease, Adult Gastroesophageal reflux (GER) happens when acid from the stomach flows up into the tube that connects the mouth and the stomach (esophagus). Normally, food travels down the esophagus and stays in the stomach to be digested. With GER, food and stomach acid sometimes move back up into the esophagus. You may have a disease called gastroesophageal reflux disease (GERD) if the reflux:  Happens often.  Causes frequent or very bad symptoms.  Causes problems such as damage to the esophagus. When this happens, the esophagus becomes sore and swollen (inflamed). Over time, GERD can make small holes (ulcers) in the lining of the esophagus. What are the causes? This condition is caused by a problem with the muscle between the esophagus and the stomach. When this muscle is weak or not normal, it does not close properly to keep food and acid from coming back up from the stomach. The muscle can be weak because of:  Tobacco use.  Pregnancy.  Having a certain type of hernia (hiatal hernia).  Alcohol use.  Certain foods and drinks, such as coffee, chocolate, onions, and peppermint. What increases the risk? You are more likely to develop this condition if you:  Are overweight.  Have a disease that affects your connective tissue.  Use NSAID medicines. What are the signs or symptoms? Symptoms of this condition include:  Heartburn.  Difficult or painful swallowing.  The feeling of having a lump in the throat.  A bitter taste in the mouth.  Bad breath.  Having a lot of saliva.  Having an upset or bloated stomach.  Belching.  Chest pain. Different conditions can cause chest pain. Make sure you see your doctor if you have chest pain.  Shortness of breath or noisy breathing (wheezing).  Ongoing (chronic) cough or a cough at night.  Wearing away of the  surface of teeth (tooth enamel).  Weight loss. How is this treated? Treatment will depend on how bad your symptoms are. Your doctor may suggest:  Changes to your diet.  Medicine.  Surgery. Follow these instructions at home: Eating and drinking   Follow a diet as told by your doctor. You may need to avoid foods and drinks such as: ? Coffee and tea (with or without caffeine). ? Drinks that contain alcohol. ? Energy drinks and sports drinks. ? Bubbly (carbonated) drinks or sodas. ? Chocolate and cocoa. ? Peppermint and mint flavorings. ? Garlic and onions. ? Horseradish. ? Spicy and acidic foods. These include peppers, chili powder, curry powder, vinegar, hot sauces, and BBQ sauce. ? Citrus fruit juices and citrus fruits, such as oranges, lemons, and limes. ? Tomato-based foods. These include red sauce, chili, salsa, and pizza with red sauce. ? Fried and fatty foods. These include donuts, french fries, potato chips, and high-fat dressings. ? High-fat meats. These include hot dogs, rib eye steak, sausage, ham, and bacon. ? High-fat dairy items, such as whole milk, butter, and cream cheese.  Eat small meals often. Avoid eating large meals.  Avoid drinking large amounts of liquid with your meals.  Avoid eating meals during the 2-3 hours before bedtime.  Avoid lying down right after you eat.  Do not exercise right after you eat. Lifestyle   Do not use any products that contain nicotine or tobacco. These include cigarettes, e-cigarettes, and chewing tobacco. If you need help quitting, ask your doctor.  Try to lower your stress. If you need help  doing this, ask your doctor.  If you are overweight, lose an amount of weight that is healthy for you. Ask your doctor about a safe weight loss goal. General instructions  Pay attention to any changes in your symptoms.  Take over-the-counter and prescription medicines only as told by your doctor. Do not take aspirin, ibuprofen, or  other NSAIDs unless your doctor says it is okay.  Wear loose clothes. Do not wear anything tight around your waist.  Raise (elevate) the head of your bed about 6 inches (15 cm).  Avoid bending over if this makes your symptoms worse.  Keep all follow-up visits as told by your doctor. This is important. Contact a doctor if:  You have new symptoms.  You lose weight and you do not know why.  You have trouble swallowing or it hurts to swallow.  You have wheezing or a cough that keeps happening.  Your symptoms do not get better with treatment.  You have a hoarse voice. Get help right away if:  You have pain in your arms, neck, jaw, teeth, or back.  You feel sweaty, dizzy, or light-headed.  You have chest pain or shortness of breath.  You throw up (vomit) and your throw-up looks like blood or coffee grounds.  You pass out (faint).  Your poop (stool) is bloody or black.  You cannot swallow, drink, or eat. Summary  If a person has gastroesophageal reflux disease (GERD), food and stomach acid move back up into the esophagus and cause symptoms or problems such as damage to the esophagus.  Treatment will depend on how bad your symptoms are.  Follow a diet as told by your doctor.  Take all medicines only as told by your doctor. This information is not intended to replace advice given to you by your health care provider. Make sure you discuss any questions you have with your health care provider. Document Released: 11/16/2007 Document Revised: 12/06/2017 Document Reviewed: 12/06/2017 Elsevier Interactive Patient Education  2019 [employer]  Heartburn Heartburn is a type of pain or discomfort that can happen in the throat or chest. It is often described as a burning pain. It may also cause a bad, acid-like taste in the mouth. Heartburn may feel worse when you lie down or bend over. It may be worse at night. It may be caused by stomach contents that move back up (reflux) into  the tube that connects the mouth with the stomach (esophagus). Follow these instructions at home: Eating and drinking   Avoid certain foods and drinks as told by your doctor. This may include: ? Coffee and tea (with or without caffeine). ? Drinks that have alcohol. ? Energy drinks and sports drinks. ? Carbonated drinks or sodas. ? Chocolate and cocoa. ? Peppermint and mint flavorings. ? Garlic and onions. ? Horseradish. ? Spicy and acidic foods, such as:  Peppers.  Chili powder and curry powder.  Vinegar.  Hot sauces and BBQ sauce. ? Citrus fruit juices and citrus fruits, such as:  Oranges.  Lemons.  Limes. ? Tomato-based foods, such as:  Red sauce and pizza with red sauce.  Chili.  Salsa. ? Fried and fatty foods, such as:  Donuts.  [place] fries and potato chips.  High-fat dressings. ? High-fat meats, such as:  Hot dogs and sausage.  Rib eye steak.  Ham and bacon. ? High-fat dairy items, such as:  Whole milk.  Butter.  Cream cheese.  Eat small meals often. Avoid eating large meals.  Avoid drinking  large amounts of liquid with your meals.  Avoid eating meals during the 2-3 hours before bedtime.  Avoid lying down right after you eat.  Do not exercise right after you eat. Lifestyle      If you are overweight, lose an amount of weight that is healthy for you. Ask your doctor about a safe weight loss goal.  Do not use any products that contain nicotine or tobacco, including cigarettes, e-cigarettes, and chewing tobacco. These can make your symptoms worse. If you need help quitting, ask your doctor.  Wear loose clothes. Do not wear anything tight around your waist.  Raise (elevate) the head of your bed about 6 inches (15 cm) when you sleep.  Try to lower your stress. If you need help doing this, ask your doctor. General instructions  Pay attention to any changes in your symptoms.  Take over-the-counter and prescription medicines only as  told by your doctor. ? Do not take aspirin, ibuprofen, or other NSAIDs unless your doctor says it is okay. ? Stop medicines only as told by your doctor.  Keep all follow-up visits as told by your doctor. This is important. Contact a doctor if:  You have new symptoms.  You lose weight and you do not know why it is happening.  You have trouble swallowing, or it hurts to swallow.  You have wheezing or a cough that keeps happening.  Your symptoms do not get better with treatment.  You have heartburn often for more than 2 weeks. Get help right away if:  You have pain in your arms, neck, jaw, teeth, or back.  You feel sweaty, dizzy, or light-headed.  You have chest pain or shortness of breath.  You throw up (vomit) and your throw up looks like blood or coffee grounds.  Your poop (stool) is bloody or black. These symptoms may represent a serious problem that is an emergency. Do not wait to see if the symptoms will go away. Get medical help right away. Call your local emergency services (911 in the [place] Do not drive yourself to the hospital. Summary  Heartburn is a type of pain that can happen in the throat or chest. It can feel like a burning pain. It may also cause a bad, acid-like taste in the mouth.  You may need to avoid certain foods and drinks to help your symptoms. Ask your doctor what foods and drinks you should avoid.  Take over-the-counter and prescription medicines only as told by your doctor. Do not take aspirin, ibuprofen, or other NSAIDs unless your doctor told you to do so.  Contact your doctor if your symptoms do not get better or they get worse. This information is not intended to replace advice given to you by your health care provider. Make sure you discuss any questions you have with your health care provider. Document Released: 02/09/2011 Document Revised: 10/30/2017 Document Reviewed: 10/30/2017 Elsevier Interactive Patient Education  2019 [employer].

## 2018-11-15 ENCOUNTER — Telehealth (INDEPENDENT_AMBULATORY_CARE_PROVIDER_SITE_OTHER): Payer: Self-pay | Admitting: *Deleted

## 2018-11-15 ENCOUNTER — Other Ambulatory Visit (INDEPENDENT_AMBULATORY_CARE_PROVIDER_SITE_OTHER): Payer: Self-pay | Admitting: Internal Medicine

## 2018-11-15 ENCOUNTER — Encounter (INDEPENDENT_AMBULATORY_CARE_PROVIDER_SITE_OTHER): Payer: Self-pay | Admitting: *Deleted

## 2018-11-15 DIAGNOSIS — K219 Gastro-esophageal reflux disease without esophagitis: Secondary | ICD-10-CM | POA: Insufficient documentation

## 2018-11-15 DIAGNOSIS — Z1211 Encounter for screening for malignant neoplasm of colon: Secondary | ICD-10-CM | POA: Insufficient documentation

## 2018-11-15 MED ORDER — PEG-KCL-NACL-NASULF-NA ASC-C 140 G PO SOLR: 1.0000 | Freq: Once | ORAL | 0 refills | Status: AC

## 2018-11-15 NOTE — Telephone Encounter (Signed)
Patient needs plenvu 

## 2019-01-09 ENCOUNTER — Other Ambulatory Visit: Payer: Self-pay | Admitting: Internal Medicine

## 2019-01-09 DIAGNOSIS — Z1231 Encounter for screening mammogram for malignant neoplasm of breast: Secondary | ICD-10-CM

## 2019-01-29 ENCOUNTER — Other Ambulatory Visit: Payer: Self-pay

## 2019-01-29 ENCOUNTER — Other Ambulatory Visit (HOSPITAL_COMMUNITY)
Admission: RE | Admit: 2019-01-29 | Discharge: 2019-01-29 | Disposition: A | Discharge status: Home / Self Care | Payer: BC Managed Care – PPO | Source: Ambulatory Visit | Attending: Internal Medicine | Admitting: Internal Medicine

## 2019-01-29 DIAGNOSIS — Z20828 Contact with and (suspected) exposure to other viral communicable diseases: Secondary | ICD-10-CM | POA: Insufficient documentation

## 2019-01-29 DIAGNOSIS — Z01812 Encounter for preprocedural laboratory examination: Secondary | ICD-10-CM | POA: Diagnosis not present

## 2019-01-29 LAB — SARS CORONAVIRUS 2 (TAT 6-24 HRS): SARS Coronavirus 2: NEGATIVE

## 2019-01-31 ENCOUNTER — Ambulatory Visit (HOSPITAL_COMMUNITY)
Admission: RE | Admit: 2019-01-31 | Discharge: 2019-01-31 | Disposition: A | Discharge status: Home / Self Care | Payer: BC Managed Care – PPO | Attending: Internal Medicine | Admitting: Internal Medicine

## 2019-01-31 ENCOUNTER — Other Ambulatory Visit: Payer: Self-pay

## 2019-01-31 ENCOUNTER — Encounter (HOSPITAL_COMMUNITY): Payer: Self-pay | Admitting: *Deleted

## 2019-01-31 ENCOUNTER — Encounter (HOSPITAL_COMMUNITY)
Admission: RE | Disposition: A | Discharge status: Home / Self Care | Payer: Self-pay | Source: Home / Self Care | Attending: Internal Medicine

## 2019-01-31 DIAGNOSIS — K2950 Unspecified chronic gastritis without bleeding: Secondary | ICD-10-CM | POA: Insufficient documentation

## 2019-01-31 DIAGNOSIS — D126 Benign neoplasm of colon, unspecified: Secondary | ICD-10-CM | POA: Insufficient documentation

## 2019-01-31 DIAGNOSIS — Z1211 Encounter for screening for malignant neoplasm of colon: Secondary | ICD-10-CM | POA: Insufficient documentation

## 2019-01-31 DIAGNOSIS — K3189 Other diseases of stomach and duodenum: Secondary | ICD-10-CM | POA: Diagnosis not present

## 2019-01-31 DIAGNOSIS — K219 Gastro-esophageal reflux disease without esophagitis: Secondary | ICD-10-CM | POA: Insufficient documentation

## 2019-01-31 DIAGNOSIS — K635 Polyp of colon: Secondary | ICD-10-CM | POA: Diagnosis not present

## 2019-01-31 DIAGNOSIS — D125 Benign neoplasm of sigmoid colon: Secondary | ICD-10-CM | POA: Diagnosis not present

## 2019-01-31 DIAGNOSIS — D123 Benign neoplasm of transverse colon: Secondary | ICD-10-CM | POA: Diagnosis not present

## 2019-01-31 HISTORY — PX: COLONOSCOPY: SHX5424

## 2019-01-31 HISTORY — PX: ESOPHAGOGASTRODUODENOSCOPY: SHX5428

## 2019-01-31 HISTORY — PX: BIOPSY: SHX5522

## 2019-01-31 HISTORY — PX: POLYPECTOMY: SHX5525

## 2019-01-31 SURGERY — COLONOSCOPY
Anesthesia: Moderate Sedation

## 2019-01-31 MED ORDER — FAMOTIDINE 20 MG PO TABS: 20.0000 mg | ORAL_TABLET | Freq: Every evening | ORAL | Status: AC | PRN

## 2019-01-31 MED ORDER — MIDAZOLAM HCL 5 MG/5ML IJ SOLN
INTRAMUSCULAR | Status: AC
  Filled 2019-01-31: qty 10

## 2019-01-31 MED ORDER — MEPERIDINE HCL 50 MG/ML IJ SOLN
INTRAMUSCULAR | Status: DC | PRN
  Administered 2019-01-31 (×2): 25 mg via INTRAVENOUS

## 2019-01-31 MED ORDER — LIDOCAINE VISCOUS HCL 2 % MT SOLN
OROMUCOSAL | Status: DC | PRN
  Administered 2019-01-31: 4 mL via OROMUCOSAL

## 2019-01-31 MED ORDER — MEPERIDINE HCL 50 MG/ML IJ SOLN
INTRAMUSCULAR | Status: AC
  Filled 2019-01-31: qty 1

## 2019-01-31 MED ORDER — STERILE WATER FOR IRRIGATION IR SOLN
Status: DC | PRN
  Administered 2019-01-31: 12:00:00 2.5 mL

## 2019-01-31 MED ORDER — LIDOCAINE VISCOUS HCL 2 % MT SOLN
OROMUCOSAL | Status: AC
  Filled 2019-01-31: qty 15

## 2019-01-31 MED ORDER — SODIUM CHLORIDE 0.9 % IV SOLN
INTRAVENOUS | Status: DC
  Administered 2019-01-31: 11:00:00 via INTRAVENOUS

## 2019-01-31 MED ORDER — MIDAZOLAM HCL 5 MG/5ML IJ SOLN
INTRAMUSCULAR | Status: DC | PRN
  Administered 2019-01-31 (×5): 2 mg via INTRAVENOUS

## 2019-01-31 NOTE — Op Note (Signed)
[facility] Patient Name: Jaime George Procedure Date: 01/31/2019 12:18 PM MRN: [id number] Date of Birth: 06/04/1966 Attending MD: Hildred Laser , MD CSN: [id number] Age: 53 Admit Type: Outpatient Procedure:                Colonoscopy Indications:              Screening for colorectal malignant neoplasm Providers:                Hildred Laser, MD, Otis Peak B. Sharon Seller, RN, Raphael Gibney, Technician Referring MD:             Asencion Noble Medicines:                Midazolam 2 mg IV Complications:            No immediate complications. Estimated Blood Loss:     Estimated blood loss was minimal. Procedure:                Pre-Anesthesia Assessment:                           - Prior to the procedure, a History and Physical                            was performed, and patient medications and                            allergies were reviewed. The patient's tolerance of                            previous anesthesia was also reviewed. The risks                            and benefits of the procedure and the sedation                            options and risks were discussed with the patient.                            All questions were answered, and informed consent                            was obtained. Prior Anticoagulants: The patient has                            taken no previous anticoagulant or antiplatelet                            agents. ASA Grade Assessment: I - A normal, healthy                            patient. After reviewing the risks and benefits,  the patient was deemed in satisfactory condition to                            undergo the procedure.                           After obtaining informed consent, the colonoscope                            was passed under direct vision. Throughout the                            procedure, the patient's blood pressure, pulse, and                            oxygen  saturations were monitored continuously. The                            [id number]-[id number] ([id number]) was introduced through the                            anus and advanced to the the cecum, identified by                            appendiceal orifice and ileocecal valve. The                            colonoscopy was performed without difficulty. The                            patient tolerated the procedure well. The quality                            of the bowel preparation was good. The ileocecal                            valve, appendiceal orifice, and rectum were                            photographed. Scope In: 12:19:41 PM Scope Out: 53:61:44 PM Scope Withdrawal Time: 0 hours 11 minutes 7 seconds  Total Procedure Duration: 0 hours 26 minutes 34 seconds  Findings:      The perianal and digital rectal examinations were normal.      A small polyp was found in the hepatic flexure. The pathology specimen       was placed into Bottle Number 2.      A small polyp was found in the proximal sigmoid colon. The polyp was       sessile. The polyp was removed with a cold snare. Resection and       retrieval were complete. The pathology specimen was placed into Bottle       Number 2.      The exam was otherwise normal throughout the examined colon.      The retroflexed view of the distal rectum and anal verge was normal and  showed no anal or rectal abnormalities. Impression:               - One small polyp at the hepatic flexure.                           - One small polyp in the proximal sigmoid colon,                            removed with a cold snare. Resected and retrieved. Moderate Sedation:      Moderate (conscious) sedation was administered by the endoscopy nurse       and supervised by the endoscopist. The following parameters were       monitored: oxygen saturation, heart rate, blood pressure, CO2       capnography and response to care. Total physician intraservice time was        31 minutes. Recommendation:           - Patient has a contact number available for                            emergencies. The signs and symptoms of potential                            delayed complications were discussed with the                            patient. Return to normal activities tomorrow.                            Written discharge instructions were provided to the                            patient.                           - Resume previous diet today.                           - Continue present medications.                           - No aspirin, ibuprofen, naproxen, or other                            non-steroidal anti-inflammatory drugs for 1 day.                           - Await pathology results.                           - Repeat colonoscopy is recommended. The                            colonoscopy date will be determined after pathology  results from today's exam become available for                            review. Procedure Code(s):        --- Professional ---                           [phone], Colonoscopy, flexible; with removal of                            tumor(s), polyp(s), or other lesion(s) by snare                            technique                           99153, Moderate sedation; each additional 15                            minutes intraservice time                           G0500, Moderate sedation services provided by the                            same physician or other qualified health care                            professional performing a gastrointestinal                            endoscopic service that sedation supports,                            requiring the presence of an independent trained                            observer to assist in the monitoring of the                            patient's level of consciousness and physiological                            status; initial 15 minutes of  intra-service time;                            patient age 40 years or older (additional time may                            be reported with [phone], as appropriate) Diagnosis Code(s):        --- Professional ---                           Z12.11, Encounter for screening for malignant  neoplasm of colon                           K63.5, Polyp of colon CPT copyright 2019 American Medical Association. All rights reserved. The codes documented in this report are preliminary and upon coder review may  be revised to meet current compliance requirements. Hildred Laser, MD Hildred Laser, MD 01/31/2019 1:02:03 PM This report has been signed electronically. Number of Addenda: 0

## 2019-01-31 NOTE — H&P (Signed)
Jaime George is an 53 y.o. female.   Chief Complaint: Patient is here for EGD and colonoscopy. HPI: Patient is a 53 year old Caucasian female who has had symptoms of GERD for about a year.  Few months ago she began to have frequent breakthrough symptoms.  She was having to take OTC NSAID antacid often.  She is watching her diet.  She also has retired and is doing much better.  Now she is not having to take Mylanta often.  She denies dysphagia nausea or vomiting.  She also denies abdominal pain or melena.  She is also undergoing screening colonoscopy.  This would be her first exam.  There is no history of recent change in bowel habits or rectal bleeding. Family history is negative for CRC.  Past Medical History:  Diagnosis Date  . GERD (gastroesophageal reflux disease)     Past Surgical History:  Procedure Laterality Date  . KNEE SURGERY    . LAPAROSCOPIC APPENDECTOMY  07/18/2011   Procedure: APPENDECTOMY LAPAROSCOPIC;  Surgeon: Donato Heinz, MD;  Location: [facility];  Service: General;  Laterality: N/A;    Family History  Problem Relation Age of Onset  . Breast cancer Neg Hx    Social History:  reports that she has never smoked. She has never used smokeless tobacco. She reports current alcohol use. She reports that she does not use drugs.  Allergies:  Allergies  Allergen Reactions  . Codeine Nausea And Vomiting  . Other     Steri strips: blisters    Medications Prior to Admission  Medication Sig Dispense Refill  . Multiple Vitamin (MULTIVITAMIN WITH MINERALS) TABS tablet Take 1 tablet by mouth daily.    . sucralfate (CARAFATE) 1 g tablet Take 1 tablet (1 g total) by mouth 4 (four) times daily -  with meals and at bedtime. (Patient not taking: Reported on 01/24/2019) 120 tablet 2    No results found for this or any previous visit (from the past 48 hour(s)). No results found.  ROS  Blood pressure 136/83, pulse 83, temperature 98.3 F (36.8 C), temperature source Oral, resp.  rate 16, height 5' 6.5" (1.689 m), weight 88.5 kg, SpO2 100 %. Physical Exam  Constitutional: She appears well-developed and well-nourished.  HENT:  Mouth/Throat: Oropharynx is clear and moist.  Eyes: Conjunctivae are normal. No scleral icterus.  Neck: No thyromegaly present.  Cardiovascular: Normal rate, regular rhythm and normal heart sounds.  No murmur heard. Respiratory: Effort normal and breath sounds normal.  GI: Soft. She exhibits no distension and no mass. There is no abdominal tenderness.  Musculoskeletal:        General: No edema.  Lymphadenopathy:    She has no cervical adenopathy.  Neurological: She is alert.  Skin: Skin is warm and dry.     Assessment/Plan Chronic GERD. Diagnostic EGD and average risk screening colonoscopy.  Hildred Laser, MD 01/31/2019, 11:54 AM

## 2019-01-31 NOTE — Op Note (Signed)
[facility] Patient Name: Jaime George Procedure Date: 01/31/2019 11:32 AM MRN: [id number] Date of Birth: June 06, 1966 Attending MD: Hildred Laser , MD CSN: [id number] Age: 53 Admit Type: Outpatient Procedure:                Upper GI endoscopy Indications:              Follow-up of gastro-esophageal reflux disease Providers:                Hildred Laser, MD, Otis Peak B. Sharon Seller, RN, Raphael Gibney, Technician Referring MD:             Asencion Noble Medicines:                Lidocaine spray, Meperidine 50 mg IV, Midazolam 8                            mg IV Complications:            No immediate complications. Estimated Blood Loss:     Estimated blood loss was minimal. Procedure:                Pre-Anesthesia Assessment:                           - Prior to the procedure, a History and Physical                            was performed, and patient medications and                            allergies were reviewed. The patient's tolerance of                            previous anesthesia was also reviewed. The risks                            and benefits of the procedure and the sedation                            options and risks were discussed with the patient.                            All questions were answered, and informed consent                            was obtained. Prior Anticoagulants: The patient has                            taken no previous anticoagulant or antiplatelet                            agents. ASA Grade Assessment: I - A normal, healthy  patient. After reviewing the risks and benefits,                            the patient was deemed in satisfactory condition to                            undergo the procedure.                           After obtaining informed consent, the endoscope was                            passed under direct vision. Throughout the                            procedure, the  patient's blood pressure, pulse, and                            oxygen saturations were monitored continuously. The                            [id number]-[id number] scope was introduced through the                            mouth, and advanced to the second part of duodenum.                            The upper GI endoscopy was accomplished without                            difficulty. The patient tolerated the procedure                            well. Scope In: 12:07:34 PM Scope Out: 12:15:58 PM Total Procedure Duration: 0 hours 8 minutes 24 seconds  Findings:      The examined esophagus was normal.      The Z-line was regular and was found 36 cm from the incisors.      A few erosions were found in the gastric antrum. Biopsies were taken       with a cold forceps for histology. The pathology specimen was placed       into Bottle Number 1.      The exam of the stomach was otherwise normal.      The duodenal bulb and second portion of the duodenum were normal. Impression:               - Normal esophagus.                           - Z-line regular, 36 cm from the incisors.                           - Erosive gastropathy. Biopsied.                           - Normal  duodenal bulb and second portion of the                            duodenum. Moderate Sedation:      Moderate (conscious) sedation was administered by the endoscopy nurse       and supervised by the endoscopist. The following parameters were       monitored: oxygen saturation, heart rate, blood pressure, CO2       capnography and response to care. Total physician intraservice time was       13 minutes.      Per Anesthesia Care Recommendation:           - Patient has a contact number available for                            emergencies. The signs and symptoms of potential                            delayed complications were discussed with the                            patient. Return to normal activities tomorrow.                             Written discharge instructions were provided to the                            patient.                           - Resume previous diet today.                           - Continue present medications.                           - Famotidine OTC 20 mg po qhs prn.                           - Await pathology results. Procedure Code(s):        --- Professional ---                           [phone], Esophagogastroduodenoscopy, flexible,                            transoral; with biopsy, single or multiple                           G0500, Moderate sedation services provided by the                            same physician or other qualified health care                            professional performing a gastrointestinal  endoscopic service that sedation supports,                            requiring the presence of an independent trained                            observer to assist in the monitoring of the                            patient's level of consciousness and physiological                            status; initial 15 minutes of intra-service time;                            patient age 65 years or older (additional time may                            be reported with [phone], as appropriate) Diagnosis Code(s):        --- Professional ---                           K31.89, Other diseases of stomach and duodenum                           K21.9, Gastro-esophageal reflux disease without                            esophagitis CPT copyright 2019 American [employer]. All rights reserved. The codes documented in this report are preliminary and upon coder review may  be revised to meet current compliance requirements. Hildred Laser, MD Hildred Laser, MD 01/31/2019 12:57:59 PM This report has been signed electronically. Number of Addenda: 0

## 2019-01-31 NOTE — Discharge Instructions (Signed)
No aspirin or NSAIDs for 24 hours. Resume usual medications as before. Can take famotidine or Pepcid OTC 20 mg in the evening or at bedtime on as-needed basis. Resume usual diet. No driving for 24 hours. Physician will call with biopsy results   Upper Endoscopy, Adult, Care After This sheet gives you information about how to care for yourself after your procedure. Your health care provider may also give you more specific instructions. If you have problems or questions, contact your health care provider. What can I expect after the procedure? After the procedure, it is common to have:  A sore throat.  Mild stomach pain or discomfort.  Bloating.  Nausea. Follow these instructions at home:   Follow instructions from your health care provider about what to eat or drink after your procedure.  Return to your normal activities as told by your health care provider. Ask your health care provider what activities are safe for you.  Take over-the-counter and prescription medicines only as told by your health care provider.  Do not drive for 24 hours if you were given a sedative during your procedure.  Keep all follow-up visits as told by your health care provider. This is important. Contact a health care provider if you have:  A sore throat that lasts longer than one day.  Trouble swallowing. Get help right away if:  You vomit blood or your vomit looks like coffee grounds.  You have: ? A fever. ? Bloody, black, or tarry stools. ? A severe sore throat or you cannot swallow. ? Difficulty breathing. ? Severe pain in your chest or abdomen. Summary  After the procedure, it is common to have a sore throat, mild stomach discomfort, bloating, and nausea.  Do not drive for 24 hours if you were given a sedative during the procedure.  Follow instructions from your health care provider about what to eat or drink after your procedure.  Return to your normal activities as told by your  health care provider. This information is not intended to replace advice given to you by your health care provider. Make sure you discuss any questions you have with your health care provider. Document Released: 11/29/2011 Document Revised: 11/21/2017 Document Reviewed: 10/30/2017 Elsevier Patient Education  [employer].   Gastritis, Adult  Gastritis is swelling (inflammation) of the stomach. Gastritis can develop quickly (acute). It can also develop slowly over time (chronic). It is important to get help for this condition. If you do not get help, your stomach can bleed, and you can get sores (ulcers) in your stomach. What are the causes? This condition may be caused by:  Germs that get to your stomach.  Drinking too much alcohol.  Medicines you are taking.  Too much acid in the stomach.  A disease of the intestines or stomach.  Stress.  An allergic reaction.  Crohn's disease.  Some cancer treatments (radiation). Sometimes the cause of this condition is not known. What are the signs or symptoms? Symptoms of this condition include:  Pain in your stomach.  A burning feeling in your stomach.  Feeling sick to your stomach (nauseous).  Throwing up (vomiting).  Feeling too full after you eat.  Weight loss.  Bad breath.  Throwing up blood.  Blood in your poop (stool). How is this diagnosed? This condition may be diagnosed with:  Your medical history and symptoms.  A physical exam.  Tests. These can include: ? Blood tests. ? Stool tests. ? A procedure to look inside your  stomach (upper endoscopy). ? A test in which a sample of tissue is taken for testing (biopsy). How is this treated? Treatment for this condition depends on what caused it. You may be given:  Antibiotic medicine, if your condition was caused by germs.  H2 blockers and similar medicines, if your condition was caused by too much acid. Follow these instructions at  home: Medicines  Take over-the-counter and prescription medicines only as told by your doctor.  If you were prescribed an antibiotic medicine, take it as told by your doctor. Do not stop taking it even if you start to feel better. Eating and drinking   Eat small meals often, instead of large meals.  Avoid foods and drinks that make your symptoms worse.  Drink enough fluid to keep your pee (urine) pale yellow. Alcohol use  Do not drink alcohol if: ? Your doctor tells you not to drink. ? You are pregnant, may be pregnant, or are planning to become pregnant.  If you drink alcohol: ? Limit your use to:  0-1 drink a day for women.  0-2 drinks a day for men. ? Be aware of how much alcohol is in your drink. In the [place] one drink equals one 12 oz bottle of beer (355 mL), one 5 oz glass of wine (148 mL), or one 1 oz glass of hard liquor (44 mL). General instructions  Talk with your doctor about ways to manage stress. You can exercise or do deep breathing, meditation, or yoga.  Do not smoke or use products that have nicotine or tobacco. If you need help quitting, ask your doctor.  Keep all follow-up visits as told by your doctor. This is important. Contact a doctor if:  Your symptoms get worse.  Your symptoms go away and then come back. Get help right away if:  You throw up blood or something that looks like coffee grounds.  You have black or dark red poop.  You throw up any time you try to drink fluids.  Your stomach pain gets worse.  You have a fever.  You do not feel better after one week. Summary  Gastritis is swelling (inflammation) of the stomach.  You must get help for this condition. If you do not get help, your stomach can bleed, and you can get sores (ulcers).  This condition is diagnosed with medical history, physical exam, or tests.  You can be treated with medicines for germs or medicines to block too much acid in your stomach. This information is not  intended to replace advice given to you by your health care provider. Make sure you discuss any questions you have with your health care provider. Document Released: 11/16/2007 Document Revised: 10/17/2017 Document Reviewed: 10/17/2017 Elsevier Patient Education  [place].   Colonoscopy, Adult, Care After This sheet gives you information about how to care for yourself after your procedure. Your doctor may also give you more specific instructions. If you have problems or questions, call your doctor. What can I expect after the procedure? After the procedure, it is common to have:  A small amount of blood in your poop for 24 hours.  Some gas.  Mild cramping or bloating in your belly. Follow these instructions at home: General instructions  For the first 24 hours after the procedure: ? Do not drive or use machinery. ? Do not sign important documents. ? Do not drink alcohol. ? Do your daily activities more slowly than normal. ? Eat foods that are soft and  easy to digest.  Take over-the-counter or prescription medicines only as told by your doctor. To help cramping and bloating:   Try walking around.  Put heat on your belly (abdomen) as told by your doctor. Use a heat source that your doctor recommends, such as a moist heat pack or a heating pad. ? Put a towel between your skin and the heat source. ? Leave the heat on for 20-30 minutes. ? Remove the heat if your skin turns bright red. This is especially important if you cannot feel pain, heat, or cold. You can get burned. Eating and drinking   Drink enough fluid to keep your pee (urine) clear or pale yellow.  Return to your normal diet as told by your doctor. Avoid heavy or fried foods that are hard to digest.  Avoid drinking alcohol for as long as told by your doctor. Contact a doctor if:  You have blood in your poop (stool) 2-3 days after the procedure. Get help right away if:  You have more than a small amount  of blood in your poop.  You see large clumps of tissue (blood clots) in your poop.  Your belly is swollen.  You feel sick to your stomach (nauseous).  You throw up (vomit).  You have a fever.  You have belly pain that gets worse, and medicine does not help your pain. Summary  After the procedure, it is common to have a small amount of blood in your poop. You may also have mild cramping and bloating in your belly.  For the first 24 hours after the procedure, do not drive or use machinery, do not sign important documents, and do not drink alcohol.  Get help right away if you have a lot of blood in your poop, feel sick to your stomach, have a fever, or have more belly pain. This information is not intended to replace advice given to you by your health care provider. Make sure you discuss any questions you have with your health care provider. Document Released: 07/02/2010 Document Revised: 03/30/2017 Document Reviewed: 02/22/2016 Elsevier Patient Education  2020 [place].   Colon Polyps  Polyps are tissue growths inside the body. Polyps can grow in many places, including the large intestine (colon). A polyp may be a round bump or a mushroom-shaped growth. You could have one polyp or several. Most colon polyps are noncancerous (benign). However, some colon polyps can become cancerous over time. Finding and removing the polyps early can help prevent this. What are the causes? The exact cause of colon polyps is not known. What increases the risk? You are more likely to develop this condition if you:  Have a family history of colon cancer or colon polyps.  Are older than 29 or older than 45 if you are African American.  Have inflammatory bowel disease, such as ulcerative colitis or Crohn's disease.  Have certain hereditary conditions, such as: ? Familial adenomatous polyposis. ? Lynch syndrome. ? Turcot syndrome. ? Peutz-Jeghers syndrome.  Are overweight.  Smoke  cigarettes.  Do not get enough exercise.  Drink too much alcohol.  Eat a diet that is high in fat and red meat and low in fiber.  Had childhood cancer that was treated with abdominal radiation. What are the signs or symptoms? Most polyps do not cause symptoms. If you have symptoms, they may include:  Blood coming from your rectum when having a bowel movement.  Blood in your stool. The stool may look dark red or black.  Abdominal pain.  A change in bowel habits, such as constipation or diarrhea. How is this diagnosed? This condition is diagnosed with a colonoscopy. This is a procedure in which a lighted, flexible scope is inserted into the anus and then passed into the colon to examine the area. Polyps are sometimes found when a colonoscopy is done as part of routine cancer screening tests. How is this treated? Treatment for this condition involves removing any polyps that are found. Most polyps can be removed during a colonoscopy. Those polyps will then be tested for cancer. Additional treatment may be needed depending on the results of testing. Follow these instructions at home: Lifestyle  Maintain a healthy weight, or lose weight if recommended by your health care provider.  Exercise every day or as told by your health care provider.  Do not use any products that contain nicotine or tobacco, such as cigarettes and e-cigarettes. If you need help quitting, ask your health care provider.  If you drink alcohol, limit how much you have: ? 0-1 drink a day for women. ? 0-2 drinks a day for men.  Be aware of how much alcohol is in your drink. In the [place] one drink equals one 12 oz bottle of beer (355 mL), one 5 oz glass of wine (148 mL), or one 1 oz shot of hard liquor (44 mL). Eating and drinking   Eat foods that are high in fiber, such as fruits, vegetables, and whole grains.  Eat foods that are high in calcium and vitamin D, such as milk, cheese, yogurt, eggs, liver, fish,  and broccoli.  Limit foods that are high in fat, such as fried foods and desserts.  Limit the amount of red meat and processed meat you eat, such as hot dogs, sausage, bacon, and lunch meats. General instructions  Keep all follow-up visits as told by your health care provider. This is important. ? This includes having regularly scheduled colonoscopies. ? Talk to your health care provider about when you need a colonoscopy. Contact a health care provider if:  You have new or worsening bleeding during a bowel movement.  You have new or increased blood in your stool.  You have a change in bowel habits.  You lose weight for no known reason. Summary  Polyps are tissue growths inside the body. Polyps can grow in many places, including the colon.  Most colon polyps are noncancerous (benign), but some can become cancerous over time.  This condition is diagnosed with a colonoscopy.  Treatment for this condition involves removing any polyps that are found. Most polyps can be removed during a colonoscopy. This information is not intended to replace advice given to you by your health care provider. Make sure you discuss any questions you have with your health care provider. Document Released: 02/24/2004 Document Revised: 09/14/2017 Document Reviewed: 09/14/2017 Elsevier Patient Education  [place] POST-ANESTHESIA  IMMEDIATELY FOLLOWING SURGERY:  Do not drive or operate machinery for the first twenty four hours after surgery.  Do not make any important decisions for twenty four hours after surgery or while taking narcotic pain medications or sedatives.  If you develop intractable nausea and vomiting or a severe headache please notify your doctor immediately.  FOLLOW-UP:  Please make an appointment with your surgeon as instructed. You do not need to follow up with anesthesia unless specifically instructed to do so.  WOUND CARE INSTRUCTIONS (if applicable):   Keep a dry clean dressing on the anesthesia/puncture wound  site if there is drainage.  Once the wound has quit draining you may leave it open to air.  Generally you should leave the bandage intact for twenty four hours unless there is drainage.  If the epidural site drains for more than 36-48 hours please call the anesthesia department.  QUESTIONS?:  Please feel free to call your physician or the hospital operator if you have any questions, and they will be happy to assist you.

## 2019-02-06 ENCOUNTER — Encounter (HOSPITAL_COMMUNITY): Payer: Self-pay | Admitting: Internal Medicine

## 2019-02-21 ENCOUNTER — Ambulatory Visit
Admission: RE | Admit: 2019-02-21 | Discharge: 2019-02-21 | Disposition: A | Discharge status: Home / Self Care | Payer: BC Managed Care – PPO | Source: Ambulatory Visit | Attending: Internal Medicine | Admitting: Internal Medicine

## 2019-02-21 ENCOUNTER — Other Ambulatory Visit: Payer: Self-pay

## 2019-02-21 DIAGNOSIS — Z1231 Encounter for screening mammogram for malignant neoplasm of breast: Secondary | ICD-10-CM

## 2019-12-02 IMAGING — MG MM DIGITAL SCREENING BILAT W/ TOMO W/ CAD
6 of 10 series · 6 of 30 positions shown · non-contrast
Comparison: Previous exam(s).

CLINICAL DATA: Screening.

EXAM:
DIGITAL SCREENING BILATERAL MAMMOGRAM WITH TOMO AND CAD

[R MLO synth-2D]
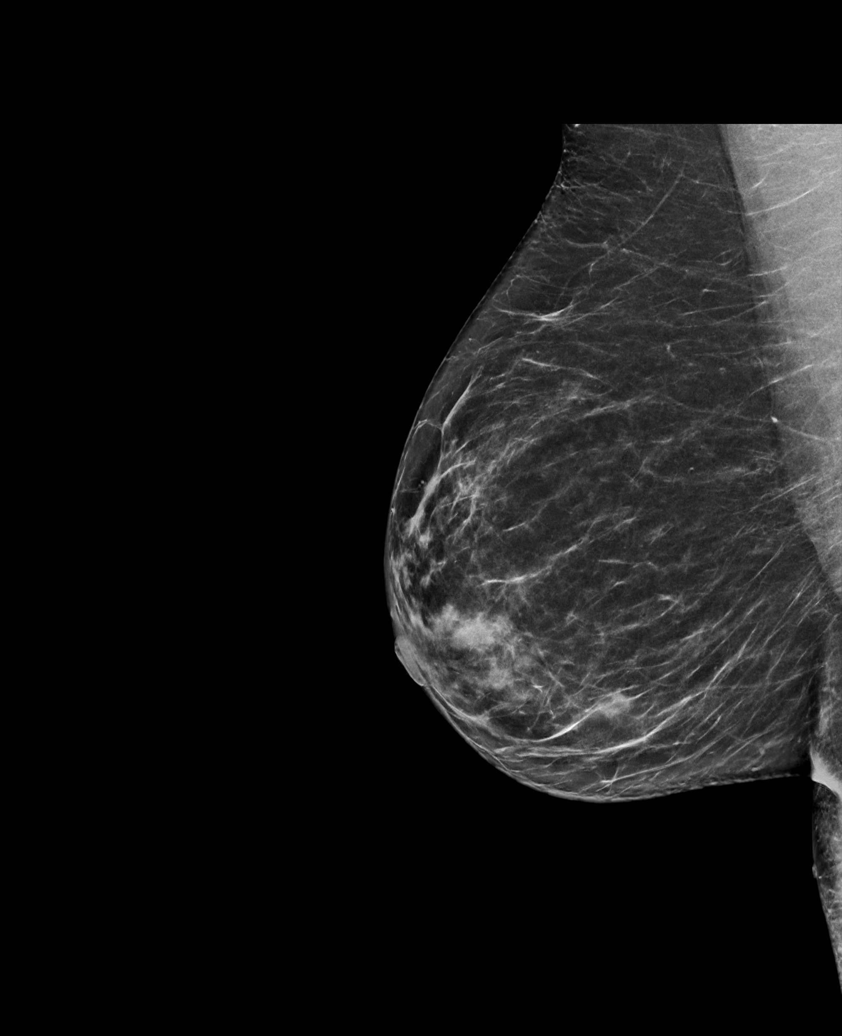

[L CC synth-2D (1 of 2)]
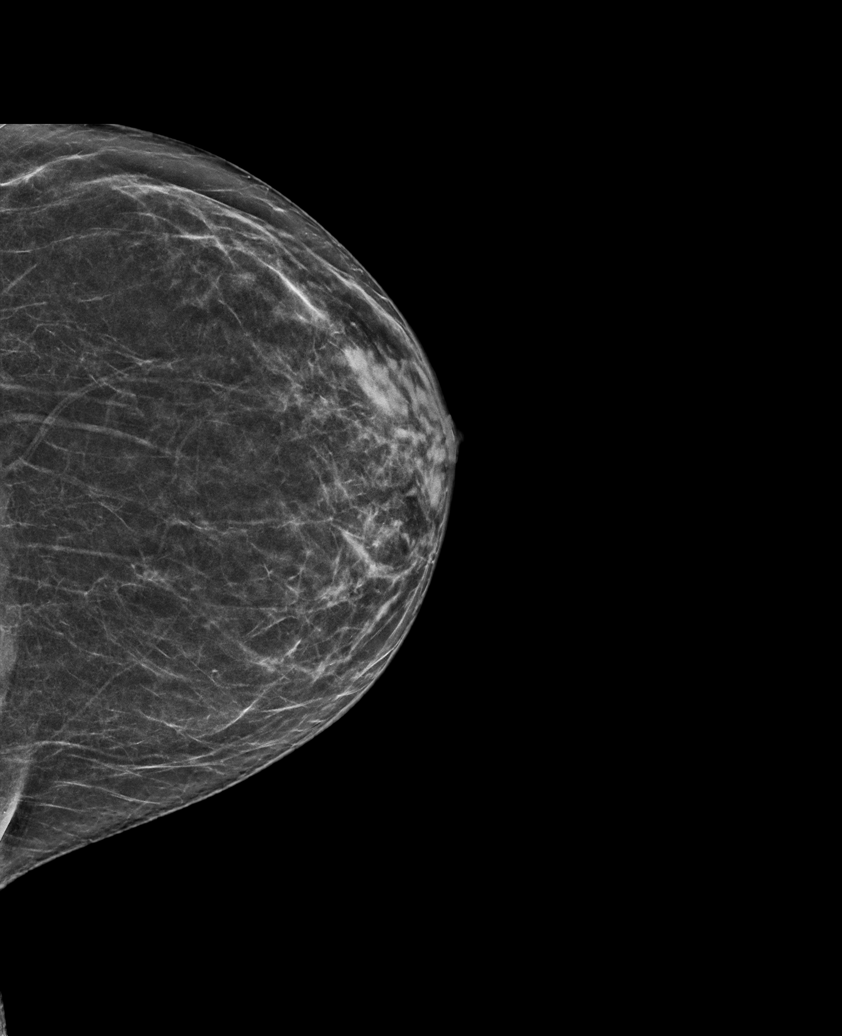

[R CC synth-2D]
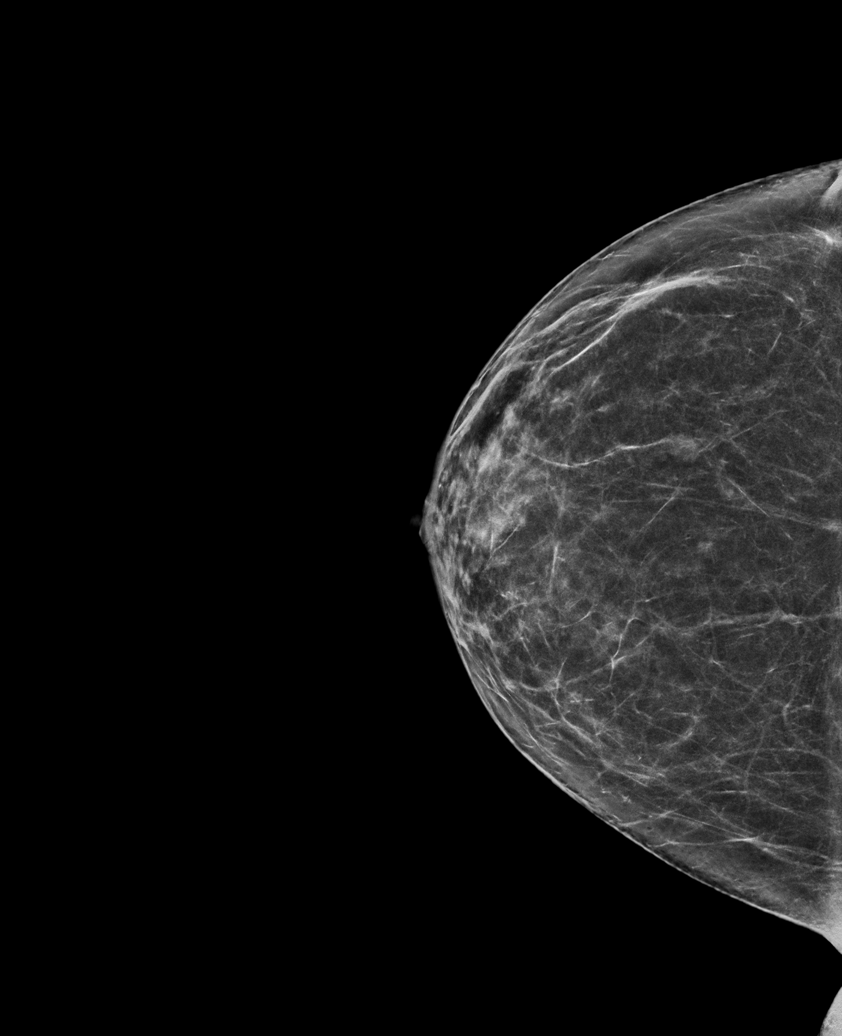

[L MLO synth-2D]
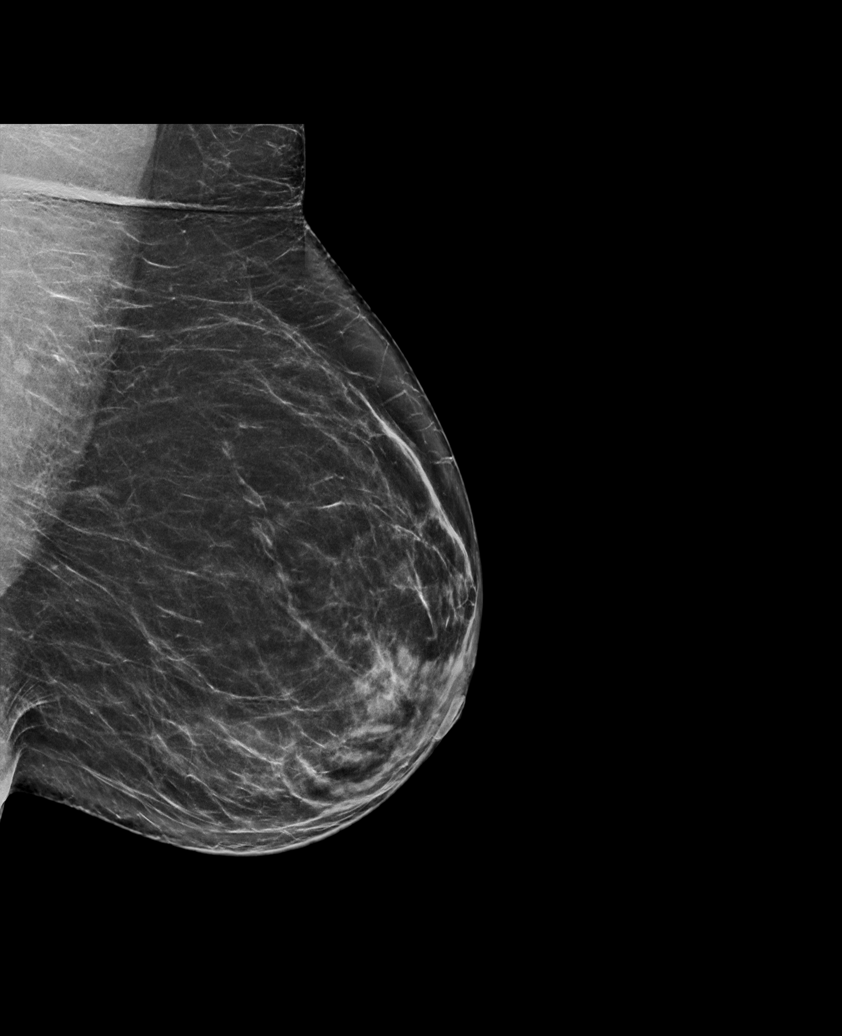

[L CC synth-2D (2 of 2)]
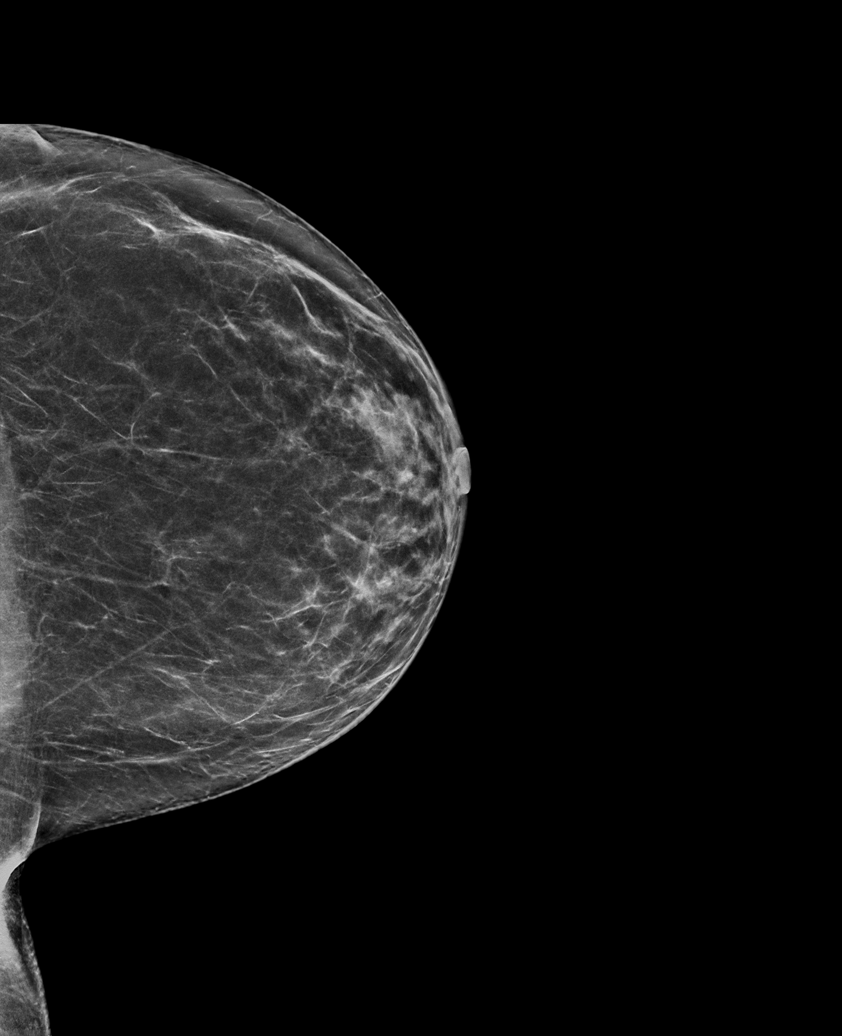

[R CC tomo · tomo slice 33/65.0]
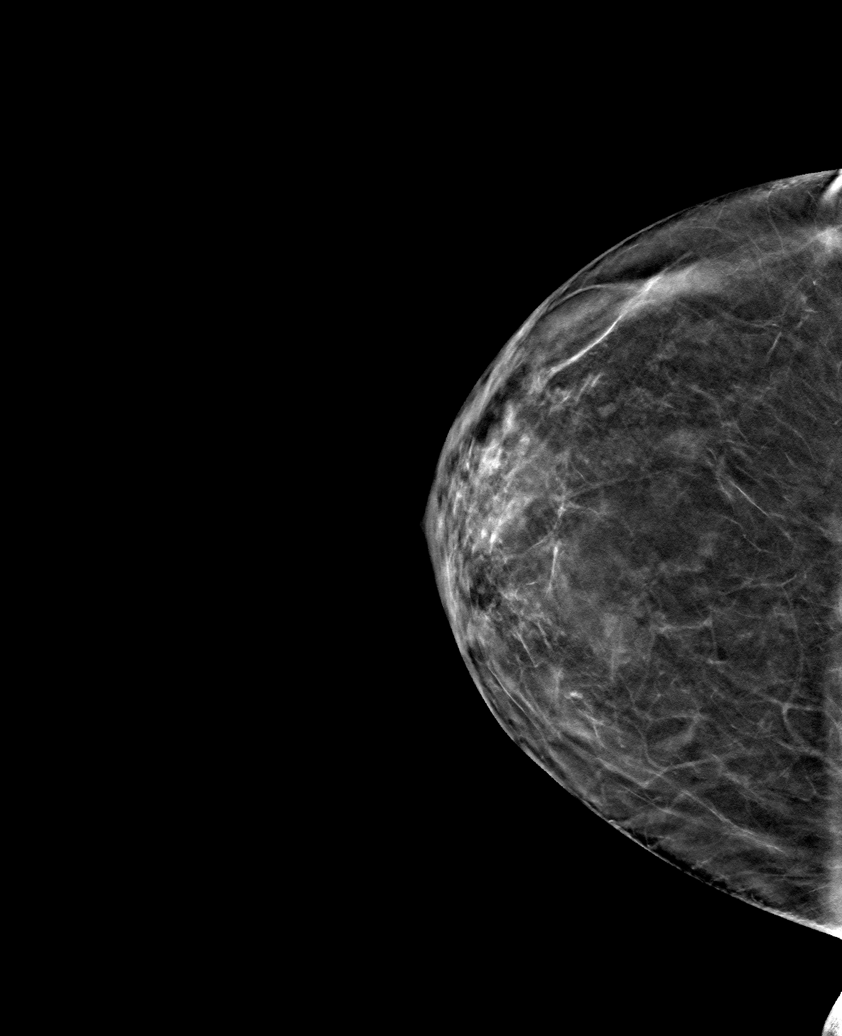

[6 of 30 positions shown; findings below may reference images not displayed]

ACR Breast Density Category b: There are scattered areas of
fibroglandular density.
FINDINGS: There are no findings suspicious for malignancy. Images were
processed with CAD.
IMPRESSION: No mammographic evidence of malignancy. A result letter of this
screening mammogram will be mailed directly to the patient.

RECOMMENDATION:
Screening mammogram in one year. (Code:[id number])

BI-RADS CATEGORY  1: Negative.

## 2020-01-13 ENCOUNTER — Other Ambulatory Visit: Payer: Self-pay | Admitting: Internal Medicine

## 2020-01-13 DIAGNOSIS — Z1231 Encounter for screening mammogram for malignant neoplasm of breast: Secondary | ICD-10-CM

## 2020-02-24 ENCOUNTER — Ambulatory Visit
Admission: RE | Admit: 2020-02-24 | Discharge: 2020-02-24 | Disposition: A | Discharge status: Home / Self Care | Payer: BC Managed Care – PPO | Source: Ambulatory Visit | Attending: Internal Medicine | Admitting: Internal Medicine

## 2020-02-24 ENCOUNTER — Other Ambulatory Visit: Payer: Self-pay

## 2020-02-24 DIAGNOSIS — Z1231 Encounter for screening mammogram for malignant neoplasm of breast: Secondary | ICD-10-CM

## 2020-03-18 ENCOUNTER — Encounter: Payer: Self-pay | Admitting: Emergency Medicine

## 2020-03-18 ENCOUNTER — Other Ambulatory Visit: Payer: Self-pay

## 2020-03-18 ENCOUNTER — Ambulatory Visit
Admission: EM | Admit: 2020-03-18 | Discharge: 2020-03-18 | Disposition: A | Discharge status: Home / Self Care | Payer: BC Managed Care – PPO | Attending: Emergency Medicine | Admitting: Emergency Medicine

## 2020-03-18 DIAGNOSIS — J0140 Acute pansinusitis, unspecified: Secondary | ICD-10-CM | POA: Diagnosis not present

## 2020-03-18 MED ORDER — AMOXICILLIN-POT CLAVULANATE 875-125 MG PO TABS: 1.0000 | ORAL_TABLET | Freq: Two times a day (BID) | ORAL | 0 refills | Status: AC

## 2020-03-18 NOTE — ED Triage Notes (Signed)
Sinus pain pressure x 2 weeks.

## 2020-03-18 NOTE — Discharge Instructions (Signed)
Get plenty of rest and push fluids Augmentin prescribed.  Take as directed and to completion Continue with OTC medications as needed Use OTC medications like ibuprofen or tylenol as needed fever or pain Call or go to the ED if you have any new or worsening symptoms such as fever, cough, shortness of breath, chest tightness, chest pain, turning blue, changes in mental status, etc..Marland Kitchen

## 2020-03-18 NOTE — ED Provider Notes (Signed)
[place]   [id number] 03/18/20 Arrival Time: 8413   CC: Sinus infection  SUBJECTIVE: History from: patient.  Jaime George is a 54 y.o. female who presents with sinus pain and pressure x 2 weeks.  Reports exposure to RSV 2 weeks ago.  Has tried OTC medications without relief.  Symptoms are made worse with time.  Reports radiating symptoms into teeth.  Reports previous symptoms in the past.   Denies fever, chills, fatigue, rhinorrhea, sore throat, SOB, wheezing, chest pain, nausea, changes in bowel or bladder habits.    ROS: As per HPI.  All other pertinent ROS negative.     Past Medical History:  Diagnosis Date  . GERD (gastroesophageal reflux disease)    Past Surgical History:  Procedure Laterality Date  . BIOPSY  01/31/2019   Procedure: BIOPSY;  Surgeon: Rogene Houston, MD;  [facility];  Service: Endoscopy;;  gastric   . COLONOSCOPY N/A 01/31/2019   Procedure: COLONOSCOPY;  Surgeon: Rogene Houston, MD;  [facility];  Service: Endoscopy;  Laterality: N/A;  1200  . ESOPHAGOGASTRODUODENOSCOPY N/A 01/31/2019   Procedure: ESOPHAGOGASTRODUODENOSCOPY (EGD);  Surgeon: Rogene Houston, MD;  [facility];  Service: Endoscopy;  Laterality: N/A;  . KNEE SURGERY    . LAPAROSCOPIC APPENDECTOMY  07/18/2011   Procedure: APPENDECTOMY LAPAROSCOPIC;  Surgeon: Donato Heinz, MD;  Location: [facility];  Service: General;  Laterality: N/A;  . POLYPECTOMY  01/31/2019   Procedure: POLYPECTOMY;  Surgeon: Rogene Houston, MD;  [facility];  Service: Endoscopy;;  colon   Allergies  Allergen Reactions  . Codeine Nausea And Vomiting  . Other     Steri strips: blisters   No current facility-administered medications on file prior to encounter.   Current Outpatient Medications on File Prior to Encounter  Medication Sig Dispense Refill  . famotidine (PEPCID) 20 MG tablet Take 1 tablet (20 mg total) by mouth at bedtime as needed for heartburn  or indigestion.    . Multiple Vitamin (MULTIVITAMIN WITH MINERALS) TABS tablet Take 1 tablet by mouth daily.    . pantoprazole (PROTONIX) 40 MG tablet Take 40 mg by mouth daily.    . sucralfate (CARAFATE) 1 g tablet Take 1 tablet (1 g total) by mouth 4 (four) times daily -  with meals and at bedtime. (Patient not taking: Reported on 01/24/2019) 120 tablet 2   Social History   Socioeconomic History  . Marital status: Married    Spouse name: Not on file  . Number of children: Not on file  . Years of education: Not on file  . Highest education level: Not on file  Occupational History  . Not on file  Tobacco Use  . Smoking status: Never Smoker  . Smokeless tobacco: Never Used  Vaping Use  . Vaping Use: Never used  Substance and Sexual Activity  . Alcohol use: Yes    Comment: occasionally  . Drug use: No  . Sexual activity: Yes    Birth control/protection: None  Other Topics Concern  . Not on file  Social History Narrative  . Not on file   Social Determinants of Health   Financial Resource Strain:   . Difficulty of Paying Living Expenses: Not on file  Food Insecurity:   . Worried About Charity fundraiser in the Last Year: Not on file  . Ran Out of Food in the Last Year: Not on file  Transportation Needs:   . Lack  of Transportation (Medical): Not on file  . Lack of Transportation (Non-Medical): Not on file  Physical Activity:   . Days of Exercise per Week: Not on file  . Minutes of Exercise per Session: Not on file  Stress:   . Feeling of Stress : Not on file  Social Connections:   . Frequency of Communication with Friends and Family: Not on file  . Frequency of Social Gatherings with Friends and Family: Not on file  . Attends Religious Services: Not on file  . Active Member of Clubs or Organizations: Not on file  . Attends Archivist Meetings: Not on file  . Marital Status: Not on file  Intimate Partner Violence:   . Fear of Current or Ex-Partner: Not on  file  . Emotionally Abused: Not on file  . Physically Abused: Not on file  . Sexually Abused: Not on file   Family History  Problem Relation Age of Onset  . Breast cancer Neg Hx     OBJECTIVE:  Vitals:   03/18/20 1500  BP: 120/79  Pulse: 80  Resp: 19  Temp: 98 F (36.7 C)  TempSrc: Oral  SpO2: 98%     General appearance: alert; appears mildly fatigued, but nontoxic; speaking in full sentences and tolerating own secretions HEENT: NCAT; Ears: EACs clear, TMs pearly gray; Eyes: PERRL.  EOM grossly intact. Sinuses: TTP; Nose: nares patent without rhinorrhea, Throat: oropharynx clear, tonsils non erythematous or enlarged, uvula midline  Neck: supple without LAD Lungs: unlabored respirations, symmetrical air entry; cough: absent; no respiratory distress; CTAB Heart: regular rate and rhythm.   Skin: warm and dry Psychological: alert and cooperative; normal mood and affect   ASSESSMENT & PLAN:  1. Acute non-recurrent pansinusitis     Meds ordered this encounter  Medications  . amoxicillin-clavulanate (AUGMENTIN) 875-125 MG tablet    Sig: Take 1 tablet by mouth every 12 (twelve) hours for 10 days.    Dispense:  20 tablet    Refill:  0    Order Specific Question:   Supervising Provider    Answer:   Raylene Everts [[id number]]    Get plenty of rest and push fluids Augmentin prescribed.  Take as directed and to completion Continue with OTC medications as needed Use OTC medications like ibuprofen or tylenol as needed fever or pain Call or go to the ED if you have any new or worsening symptoms such as fever, cough, shortness of breath, chest tightness, chest pain, turning blue, changes in mental status, etc...   Reviewed expectations re: course of current medical issues. Questions answered. Outlined signs and symptoms indicating need for more acute intervention. Patient verbalized understanding. After Visit Summary given.         Lestine Box, PA-C 03/18/20  1503

## 2020-06-11 ENCOUNTER — Ambulatory Visit: Payer: BC Managed Care – PPO | Attending: Internal Medicine

## 2020-06-11 DIAGNOSIS — Z23 Encounter for immunization: Secondary | ICD-10-CM

## 2020-06-11 NOTE — Progress Notes (Addendum)
   Covid-19 Vaccination Clinic  Name:  Jaime George    MRN: [id number] DOB: August 21, 1965  06/11/2020  [name] was observed post Covid-19 immunization for 30 minutes without incident. She was provided with Vaccine Information Sheet and instruction to access the V-Safe system.   [name] was instructed to call 911 with any severe reactions post vaccine: Marland Kitchen Difficulty breathing  . Swelling of face and throat  . A fast heartbeat  . A bad rash all over body  . Dizziness and weakness   Immunizations Administered    Name Date Dose VIS Date Route   Pfizer COVID-19 Vaccine 06/11/2020  1:10 PM 0.3 mL 04/01/2020 Intramuscular   Manufacturer: [employer], [employer]   Lot: [id number]   NDC: [address]-2

## 2020-07-22 ENCOUNTER — Encounter: Payer: Self-pay | Admitting: Orthopaedic Surgery

## 2020-07-22 ENCOUNTER — Ambulatory Visit: Payer: Self-pay

## 2020-07-22 ENCOUNTER — Ambulatory Visit (INDEPENDENT_AMBULATORY_CARE_PROVIDER_SITE_OTHER): Payer: BC Managed Care – PPO | Admitting: Orthopaedic Surgery

## 2020-07-22 ENCOUNTER — Other Ambulatory Visit: Payer: Self-pay

## 2020-07-22 VITALS — Ht 67.0 in | Wt 195.0 lb

## 2020-07-22 DIAGNOSIS — M542 Cervicalgia: Secondary | ICD-10-CM | POA: Insufficient documentation

## 2020-07-22 NOTE — Progress Notes (Signed)
Office Visit Note   Patient: Jaime George           Date of Birth: [birth year]           MRN: [id number] Visit Date: 07/22/2020              Requested by: Asencion Noble, MD [address] [place],  [place] [address] PCP: Asencion Noble, MD   Assessment & Plan: Visit Diagnoses:  1. Neck pain     Plan: Mild degenerative arthritis C4-C5 that is probably causing her neck discomfort.  No evidence of neurologic deficit or radiculopathy.  We will try a course of physical therapy and have her return as needed.  Try over-the-counter medicines. Follow-Up Instructions: No follow-ups on file.   Orders:  Orders Placed This Encounter  Procedures  . XR Cervical Spine 2 or 3 views   No orders of the defined types were placed in this encounter.     Procedures: No procedures performed   Clinical Data: No additional findings.   Subjective: Chief Complaint  Patient presents with  . Neck - Pain  Patient presents today for neck pain. She said that it has been hurting for at least two months. No known injury. She said that it hurt on the right side of her neck and radiates down into her scapula area. She had a massage recently and that helped some. She has numbness down her arm. She states that she has constant pain and burning.  She has tried icing, heat, and Ibuprofen. She is right hand dominant. Her range of motion in her shoulder is good.  HPI  Review of Systems   Objective: Vital Signs: There were no vitals taken for this visit.  Physical Exam Constitutional:      Appearance: She is well-developed and well-nourished.  HENT:     Mouth/Throat:     Mouth: Oropharynx is clear and moist.  Eyes:     Extraocular Movements: EOM normal.     Pupils: Pupils are equal, round, and reactive to light.  Pulmonary:     Effort: Pulmonary effort is normal.  Skin:    General: Skin is warm and dry.  Neurological:     Mental Status: She is alert and oriented to person, place, and time.   Psychiatric:        Mood and Affect: Mood and affect normal.        Behavior: Behavior normal.     Ortho Exam awake alert and oriented x3 comfortable sitting.  No acute distress.  Has some very mild loss of cervical spine motion particularly rotating to the right and to the left there was some mild neck discomfort in that area and even some referred pain in the interscapular region.  Had several areas of trigger point tenderness in the left parascapular region.  No masses.  No shoulder pain.  Full overhead motion.  Reflexes symmetrical.  Neurologically intact had almost full neck extension and able to touch chin to the chest  Specialty Comments:  No specialty comments available.  Imaging: No results found.   PMFS History: Patient Active Problem List   Diagnosis Date Noted  . Gastroesophageal reflux disease without esophagitis 11/15/2018  . Encounter for screening colonoscopy 11/15/2018  . Unilateral primary osteoarthritis, left knee 04/25/2018  . Muscle weakness (generalized) 01/25/2013  . Pain in joint, shoulder region 01/25/2013  . Arthropathy of shoulder region 01/25/2013   Past Medical History:  Diagnosis Date  . GERD (gastroesophageal reflux disease)  Family History  Problem Relation Age of Onset  . Breast cancer Neg Hx     Past Surgical History:  Procedure Laterality Date  . BIOPSY  01/31/2019   Procedure: BIOPSY;  Surgeon: Rogene Houston, MD;  [facility];  Service: Endoscopy;;  gastric   . COLONOSCOPY N/A 01/31/2019   Procedure: COLONOSCOPY;  Surgeon: Rogene Houston, MD;  [facility];  Service: Endoscopy;  Laterality: N/A;  1200  . ESOPHAGOGASTRODUODENOSCOPY N/A 01/31/2019   Procedure: ESOPHAGOGASTRODUODENOSCOPY (EGD);  Surgeon: Rogene Houston, MD;  [facility];  Service: Endoscopy;  Laterality: N/A;  . KNEE SURGERY    . LAPAROSCOPIC APPENDECTOMY  07/18/2011   Procedure: APPENDECTOMY LAPAROSCOPIC;  Surgeon: Donato Heinz,  MD;  [facility];  Service: General;  Laterality: N/A;  . POLYPECTOMY  01/31/2019   Procedure: POLYPECTOMY;  Surgeon: Rogene Houston, MD;  [facility];  Service: Endoscopy;;  colon   Social History   Occupational History  . Not on file  Tobacco Use  . Smoking status: Never Smoker  . Smokeless tobacco: Never Used  Vaping Use  . Vaping Use: Never used  Substance and Sexual Activity  . Alcohol use: Yes    Comment: occasionally  . Drug use: No  . Sexual activity: Yes    Birth control/protection: None

## 2020-08-06 ENCOUNTER — Ambulatory Visit (HOSPITAL_COMMUNITY): Payer: BC Managed Care – PPO | Admitting: Physical Therapy

## 2020-08-10 ENCOUNTER — Ambulatory Visit (HOSPITAL_COMMUNITY): Payer: BC Managed Care – PPO | Attending: Orthopaedic Surgery | Admitting: Physical Therapy

## 2020-08-10 ENCOUNTER — Encounter (HOSPITAL_COMMUNITY): Payer: Self-pay | Admitting: Physical Therapy

## 2020-08-10 ENCOUNTER — Other Ambulatory Visit: Payer: Self-pay

## 2020-08-10 DIAGNOSIS — G8929 Other chronic pain: Secondary | ICD-10-CM

## 2020-08-10 DIAGNOSIS — M898X1 Other specified disorders of bone, shoulder: Secondary | ICD-10-CM | POA: Insufficient documentation

## 2020-08-10 DIAGNOSIS — M6281 Muscle weakness (generalized): Secondary | ICD-10-CM

## 2020-08-10 DIAGNOSIS — M542 Cervicalgia: Secondary | ICD-10-CM

## 2020-08-10 NOTE — Therapy (Signed)
[place], [place], [address] Phone: [phone]   Fax:  [phone]  Physical Therapy Evaluation  Patient Details  Name: Jaime George MRN: [id number] Date of Birth: 02-16-1966 Referring Provider (PT): Phineas Inches, MD   Encounter Date: 08/10/2020   PT End of Session - 08/10/20 0825    Visit Number 1    Number of Visits 8    Date for PT Re-Evaluation 09/07/20    Authorization Type [employer]BS  no auth or VL    Progress Note Due on Visit 10    PT Start Time 0745    PT Stop Time 0820    PT Time Calculation (min) 35 min    Activity Tolerance Patient tolerated treatment well    Behavior During Therapy [facility] for tasks assessed/performed           Past Medical History:  Diagnosis Date  . GERD (gastroesophageal reflux disease)     Past Surgical History:  Procedure Laterality Date  . BIOPSY  01/31/2019   Procedure: BIOPSY;  Surgeon: Rogene Houston, MD;  [facility];  Service: Endoscopy;;  gastric   . COLONOSCOPY N/A 01/31/2019   Procedure: COLONOSCOPY;  Surgeon: Rogene Houston, MD;  [facility];  Service: Endoscopy;  Laterality: N/A;  1200  . ESOPHAGOGASTRODUODENOSCOPY N/A 01/31/2019   Procedure: ESOPHAGOGASTRODUODENOSCOPY (EGD);  Surgeon: Rogene Houston, MD;  [facility];  Service: Endoscopy;  Laterality: N/A;  . KNEE SURGERY    . LAPAROSCOPIC APPENDECTOMY  07/18/2011   Procedure: APPENDECTOMY LAPAROSCOPIC;  Surgeon: Donato Heinz, MD;  [facility];  Service: General;  Laterality: N/A;  . POLYPECTOMY  01/31/2019   Procedure: POLYPECTOMY;  Surgeon: Rogene Houston, MD;  [facility];  Service: Endoscopy;;  colon    There were no vitals filed for this visit.    Subjective Assessment - 08/10/20 0829    Subjective States that she has trigger points along her left neck and shoulder blade. States that her neck is stiff when turning her head and has numbness  down right arm and into all fingers. States she has a history of carpal tunnel. States that she wears a brace at night for her carpal tunnel and the brace helps sometimes. States that her neck symptoms started about 3 months ago, states that she has young grandchildren that she has been carrying her around. States that she has been getting massages and it helped some. States that her hips and shoulders look off per her massage therapist. States she is seeing her on Thursday. Current pain is 5/10 described as sharp in shoulder and shoulder blade on right. States she has to turn her whole body to see her grandson.    Pertinent History R RCR in 2014    Currently in Pain? Yes    Pain Score 5     Pain Location Shoulder    Pain Orientation Right;Posterior    Pain Descriptors / Indicators Stabbing    Pain Radiating Towards numbness down right UE    Aggravating Factors  turning head and using right UE    Pain Relieving Factors heat              [facility] PT Assessment - 08/10/20 0001      Assessment   Medical Diagnosis neck pain    Referring Provider (PT) Phineas Inches, MD    Prior Therapy yes for R shoulder  Balance Screen   Has the patient fallen in the past 6 months No      Observation/Other Assessments   Focus on Therapeutic Outcomes (FOTO)  54% function      ROM / Strength   AROM / PROM / Strength AROM;Strength      AROM   AROM Assessment Site Shoulder;Cervical    Right/Left Shoulder Right;Left    Right Shoulder Internal Rotation --   to L 4 SP   Right Shoulder External Rotation --   to right scapular border medial - painful in right shulder border   Left Shoulder Internal Rotation --   to T12 SP   Left Shoulder External Rotation --   to left scapular border medial   Cervical Flexion 60   pulling on right posterior neck   Cervical Extension 53    Cervical - Right Side Bend 35    Cervical - Left Side Bend 15   pulling pain on right side   Cervical - Right Rotation 50    increased pulling and pain on right side   Cervical - Left Rotation 78   pulling on right side     Strength   Strength Assessment Site Shoulder    Right/Left Shoulder Right;Left    Right Shoulder Flexion 4+/5   uncomfortable on right side   Right Shoulder ABduction 5/5   discomfort along right shoulder blade   Right Shoulder Internal Rotation 4/5    Right Shoulder External Rotation 4+/5    Left Shoulder Flexion 4+/5    Left Shoulder ABduction 5/5    Left Shoulder Internal Rotation 4/5    Left Shoulder External Rotation 5/5      Palpation   Spinal mobility hypomobility noted throughout cervical spine with R> L and more tender then left.    Palpation comment tenderness to palpation along R rhomboids, Trapezius and rotator cuff      Special Tests   Other special tests neg nerve tests median, radial, ulnar; traction of cervical spine - felt good - no change in radicular symptoms                      Objective measurements completed on examination: See above findings.       [facility] Adult PT Treatment/Exercise - 08/10/20 0001      Exercises   Exercises Neck      Neck Exercises: Supine   Neck Retraction 5 reps;5 secs    Cervical Rotation Both;15 reps   10" holds                 PT Education - 08/10/20 0828    Education Details on anatomy, on HEP, on findings and focus of PT    Person(s) Educated Patient    Methods Explanation    Comprehension Verbalized understanding            PT Short Term Goals - 08/10/20 0836      PT SHORT TERM GOAL #1   Title Patient will be independent in self management strategies to improve quality of life and functional outcomes.    Time 2    Period Weeks    Status New    Target Date 08/24/20      PT SHORT TERM GOAL #2   Title Patient will be able to demonstrate at least 70 degrees of cervical rotation in sitting position to improve ability to turn head and check back seat while driving    Time 2  Period Weeks     Status New    Target Date 08/24/20      PT SHORT TERM GOAL #3   Title Patient will report at least 50% improvement in overall symptoms and/or function to demonstrate improved functional mobility    Time 2    Period Weeks    Status New    Target Date 08/24/20             PT Long Term Goals - 08/10/20 0837      PT LONG TERM GOAL #1   Title Patient will report no radicular symptoms in right upper extrenmity to improve QOL.    Time 4    Period Weeks    Status New    Target Date 09/07/20      PT LONG TERM GOAL #2   Title Patient will report at least 75% improvement in overall symptoms and/or function to demonstrate improved functional mobility    Time 4    Period Weeks    Status New    Target Date 09/07/20      PT LONG TERM GOAL #3   Title Patient will improve on FOTO score to meet predicted outcomes to demonstrate improved functional mobility.    Time 4    Period Weeks    Status New    Target Date 09/07/20                  Plan - 08/10/20 0825    Clinical Impression Statement Patient presents to therapy with complaints of right sided neck and shoulder pain. Patient presents with limitations in right shoulder and scapular mobility along with thoracic and cervical hypomobility. Educated patient on current presentation and plan for PT. Patient would greatly benefit from skilled physical therapy to improve overall function and quality of life.    Personal Factors and Comorbidities Comorbidity 1    Comorbidities history of right RCR    Examination-Activity Limitations Lift;Reach Overhead    Examination-Participation Restrictions Cleaning;Other;Driving   playing and holding grandchildren   Stability/Clinical Decision Making Stable/Uncomplicated    Clinical Decision Making Low    Rehab Potential Good    PT Frequency 2x / week    PT Duration 4 weeks    PT Treatment/Interventions ADLs/Self Care Home Management;Cryotherapy;Electrical Stimulation;Moist  Heat;Traction;Therapeutic exercise;Therapeutic activities;Neuromuscular re-education;Patient/family education;Manual techniques;Taping;Dry needling;Passive range of motion;Spinal Manipulations;Joint Manipulations    PT Next Visit Plan thoracic/scapular mobility (thread the needle, protraction), thoracic extension, joint mobs of thoracic spine/traction of cervical spine    PT Home Exercise Plan 2/28 neck ROT supine, chin tucks    Consulted and Agree with Plan of Care Patient           Patient will benefit from skilled therapeutic intervention in order to improve the following deficits and impairments:  Pain,Decreased range of motion,Hypomobility,Postural dysfunction,Decreased activity tolerance  Visit Diagnosis: Cervicalgia  Muscle weakness (generalized)  Chronic scapular pain     Problem List Patient Active Problem List   Diagnosis Date Noted  . Cervicalgia 07/22/2020  . Gastroesophageal reflux disease without esophagitis 11/15/2018  . Encounter for screening colonoscopy 11/15/2018  . Unilateral primary osteoarthritis, left knee 04/25/2018  . Muscle weakness (generalized) 01/25/2013  . Pain in joint, shoulder region 01/25/2013  . Arthropathy of shoulder region 01/25/2013    9:01 AM, 08/10/20 Jerene Pitch, DPT Physical Therapy with [facility]  [phone] office   [facility] [address] [place], [place], [address] Phone: [phone]  Fax:  [phone]  Name: Jaime George MRN: [id number] Date of Birth: 06-11-1966

## 2020-08-14 ENCOUNTER — Encounter (HOSPITAL_COMMUNITY): Payer: Self-pay

## 2020-08-14 ENCOUNTER — Ambulatory Visit (HOSPITAL_COMMUNITY): Payer: BC Managed Care – PPO | Attending: Orthopaedic Surgery

## 2020-08-14 ENCOUNTER — Other Ambulatory Visit: Payer: Self-pay

## 2020-08-14 DIAGNOSIS — G8929 Other chronic pain: Secondary | ICD-10-CM | POA: Insufficient documentation

## 2020-08-14 DIAGNOSIS — M898X1 Other specified disorders of bone, shoulder: Secondary | ICD-10-CM | POA: Insufficient documentation

## 2020-08-14 DIAGNOSIS — M542 Cervicalgia: Secondary | ICD-10-CM | POA: Insufficient documentation

## 2020-08-14 DIAGNOSIS — M6281 Muscle weakness (generalized): Secondary | ICD-10-CM | POA: Diagnosis present

## 2020-08-14 NOTE — Therapy (Signed)
[place], [place], [address] Phone: [phone]   Fax:  [phone]  Physical Therapy Treatment  Patient Details  Name: Jaime George MRN: [id number] Date of Birth: October 01, 1965 Referring Provider (PT): Phineas Inches, MD   Encounter Date: 08/14/2020   PT End of Session - 08/14/20 1457    Visit Number 2    Number of Visits 8    Date for PT Re-Evaluation 09/07/20    Authorization Type BCBS  no auth or VL    Progress Note Due on Visit 10    PT Start Time 6063    PT Stop Time 1531    PT Time Calculation (min) 38 min    Activity Tolerance Patient tolerated treatment well    Behavior During Therapy [facility] for tasks assessed/performed           Past Medical History:  Diagnosis Date  . GERD (gastroesophageal reflux disease)     Past Surgical History:  Procedure Laterality Date  . BIOPSY  01/31/2019   Procedure: BIOPSY;  Surgeon: Rogene Houston, MD;  [facility];  Service: Endoscopy;;  gastric   . COLONOSCOPY N/A 01/31/2019   Procedure: COLONOSCOPY;  Surgeon: Rogene Houston, MD;  [facility];  Service: Endoscopy;  Laterality: N/A;  1200  . ESOPHAGOGASTRODUODENOSCOPY N/A 01/31/2019   Procedure: ESOPHAGOGASTRODUODENOSCOPY (EGD);  Surgeon: Rogene Houston, MD;  [facility];  Service: Endoscopy;  Laterality: N/A;  . KNEE SURGERY    . LAPAROSCOPIC APPENDECTOMY  07/18/2011   Procedure: APPENDECTOMY LAPAROSCOPIC;  Surgeon: Donato Heinz, MD;  Location: [facility];  Service: General;  Laterality: N/A;  . POLYPECTOMY  01/31/2019   Procedure: POLYPECTOMY;  Surgeon: Rogene Houston, MD;  [facility];  Service: Endoscopy;;  colon    There were no vitals filed for this visit.   Subjective Assessment - 08/14/20 1455    Subjective Pt reports she is feeling better today.  Reports compliance with HEP and feels it is helpful.    Pertinent History R RCR in 2014    Currently in  Pain? Yes    Pain Score 2     Pain Location Neck    Pain Orientation Right    Pain Descriptors / Indicators Tightness    Pain Type Chronic pain    Pain Radiating Towards no reports of radicular symptoms, numbness down Rt UE    Pain Onset More than a month ago    Pain Frequency Constant    Aggravating Factors  turning head and using Rt UE    Pain Relieving Factors heat    Effect of Pain on Daily Activities Limits, pushes through the pain                             [facility] Adult PT Treatment/Exercise - 08/14/20 0001      Exercises   Exercises Neck      Neck Exercises: Standing   Other Standing Exercises protract/retract with hands on wall 10x      Neck Exercises: Seated   Other Seated Exercise 3D thoracic excursion 5-10 reps    Other Seated Exercise Thoracic extension 5x over chair with support      Neck Exercises: Supine   Neck Retraction 10 reps;5 secs    Cervical Rotation Both;10 reps      Neck Exercises: Prone   Other Prone Exercise quadruped: cat/camel  Other Prone Exercise quadruped: thread the needle                  PT Education - 08/14/20 1505    Education Details Reviewed goals, educated importance of HEP compliance for maximal benefits, pt able to recall and demonstate appropriate mechanics.    Person(s) Educated Patient    Methods Explanation    Comprehension Verbalized understanding            PT Short Term Goals - 08/10/20 0836      PT SHORT TERM GOAL #1   Title Patient will be independent in self management strategies to improve quality of life and functional outcomes.    Time 2    Period Weeks    Status New    Target Date 08/24/20      PT SHORT TERM GOAL #2   Title Patient will be able to demonstrate at least 70 degrees of cervical rotation in sitting position to improve ability to turn head and check back seat while driving    Time 2    Period Weeks    Status New    Target Date 08/24/20      PT SHORT TERM GOAL #3    Title Patient will report at least 50% improvement in overall symptoms and/or function to demonstrate improved functional mobility    Time 2    Period Weeks    Status New    Target Date 08/24/20             PT Long Term Goals - 08/10/20 0837      PT LONG TERM GOAL #1   Title Patient will report no radicular symptoms in right upper extrenmity to improve QOL.    Time 4    Period Weeks    Status New    Target Date 09/07/20      PT LONG TERM GOAL #2   Title Patient will report at least 75% improvement in overall symptoms and/or function to demonstrate improved functional mobility    Time 4    Period Weeks    Status New    Target Date 09/07/20      PT LONG TERM GOAL #3   Title Patient will improve on FOTO score to meet predicted outcomes to demonstrate improved functional mobility.    Time 4    Period Weeks    Status New    Target Date 09/07/20                 Plan - 08/14/20 1528    Clinical Impression Statement Reviewed goals, educated importance of HEP compliance for maximal benefits, pt able to recall and demonstate appropriate mechanics.  Session focus on spinal mobility and postural strengthening.  Pt able to complete all exercises wiht no reports of increased pain, did require verbal and tactile cueing for appropraite protraction/retraction mechancis and to relax upper traps.  No pain, did stated some tightness on Rt neck at EOS.    Personal Factors and Comorbidities Comorbidity 1    Examination-Activity Limitations Lift;Reach Overhead    Examination-Participation Restrictions Cleaning;Other;Driving   playing and holding grandchildren   Stability/Clinical Decision Making Stable/Uncomplicated    Clinical Decision Making Low    Rehab Potential Good    PT Frequency 2x / week    PT Duration 4 weeks    PT Treatment/Interventions ADLs/Self Care Home Management;Cryotherapy;Electrical Stimulation;Moist Heat;Traction;Therapeutic exercise;Therapeutic  activities;Neuromuscular re-education;Patient/family education;Manual techniques;Taping;Dry needling;Passive range of motion;Spinal Manipulations;Joint Manipulations    PT Next  Visit Plan Add levator stretch next session, continue thoracic/scapular mobility (thread the needle, protraction), thoracic extension, joint mobs of thoracic spine/traction of cervical spine    PT Home Exercise Plan 2/28 neck ROT supine, chin tucks           Patient will benefit from skilled therapeutic intervention in order to improve the following deficits and impairments:  Pain,Decreased range of motion,Hypomobility,Postural dysfunction,Decreased activity tolerance  Visit Diagnosis: Cervicalgia  Muscle weakness (generalized)  Chronic scapular pain     Problem List Patient Active Problem List   Diagnosis Date Noted  . Cervicalgia 07/22/2020  . Gastroesophageal reflux disease without esophagitis 11/15/2018  . Encounter for screening colonoscopy 11/15/2018  . Unilateral primary osteoarthritis, left knee 04/25/2018  . Muscle weakness (generalized) 01/25/2013  . Pain in joint, shoulder region 01/25/2013  . Arthropathy of shoulder region 01/25/2013   Ihor Austin, LPTA/CLT; CBIS [phone]  Aldona Lento 08/14/2020, 3:33 PM  [place] [address] [place], [place], [address] Phone: [phone]   Fax:  [phone]  Name: Jaime George MRN: [id number] Date of Birth: 02-09-1966

## 2020-08-18 ENCOUNTER — Other Ambulatory Visit: Payer: Self-pay

## 2020-08-18 ENCOUNTER — Ambulatory Visit (HOSPITAL_COMMUNITY): Payer: BC Managed Care – PPO | Admitting: Physical Therapy

## 2020-08-18 ENCOUNTER — Encounter (HOSPITAL_COMMUNITY): Payer: Self-pay | Admitting: Physical Therapy

## 2020-08-18 DIAGNOSIS — M542 Cervicalgia: Secondary | ICD-10-CM | POA: Diagnosis not present

## 2020-08-18 DIAGNOSIS — G8929 Other chronic pain: Secondary | ICD-10-CM

## 2020-08-18 DIAGNOSIS — M6281 Muscle weakness (generalized): Secondary | ICD-10-CM

## 2020-08-18 NOTE — Therapy (Signed)
[place], [place], [address] Phone: [phone]   Fax:  [phone]  Physical Therapy Treatment  Patient Details  Name: Jaime George MRN: [id number] Date of Birth: April 20, 1966 Referring Provider (PT): Phineas Inches, MD   Encounter Date: 08/18/2020   PT End of Session - 08/18/20 1449    Visit Number 3    Number of Visits 8    Date for PT Re-Evaluation 09/07/20    Authorization Type BCBS  no auth or VL    Progress Note Due on Visit 10    PT Start Time 2025    PT Stop Time 1528    PT Time Calculation (min) 39 min    Activity Tolerance Patient tolerated treatment well    Behavior During Therapy [facility] for tasks assessed/performed           Past Medical History:  Diagnosis Date  . GERD (gastroesophageal reflux disease)     Past Surgical History:  Procedure Laterality Date  . BIOPSY  01/31/2019   Procedure: BIOPSY;  Surgeon: Rogene Houston, MD;  [facility];  Service: Endoscopy;;  gastric   . COLONOSCOPY N/A 01/31/2019   Procedure: COLONOSCOPY;  Surgeon: Rogene Houston, MD;  [facility];  Service: Endoscopy;  Laterality: N/A;  1200  . ESOPHAGOGASTRODUODENOSCOPY N/A 01/31/2019   Procedure: ESOPHAGOGASTRODUODENOSCOPY (EGD);  Surgeon: Rogene Houston, MD;  [facility];  Service: Endoscopy;  Laterality: N/A;  . KNEE SURGERY    . LAPAROSCOPIC APPENDECTOMY  07/18/2011   Procedure: APPENDECTOMY LAPAROSCOPIC;  Surgeon: Donato Heinz, MD;  [facility]: [facility];  Service: General;  Laterality: N/A;  . POLYPECTOMY  01/31/2019   Procedure: POLYPECTOMY;  Surgeon: Rogene Houston, MD;  [facility];  Service: Endoscopy;;  colon    There were no vitals filed for this visit.   Subjective Assessment - 08/18/20 1450    Subjective Patient states her neck is feeling alright but her upper back still bothers her. She was having a lot pain in that spot the other night. Her home  exercises are going well. Her arm feels numb today.    Pertinent History R RCR in 2014    Currently in Pain? Yes    Pain Score 8     Pain Location Back    Pain Orientation Mid    Pain Descriptors / Indicators Sharp    Pain Type Chronic pain    Pain Onset More than a month ago                             [facility] Adult PT Treatment/Exercise - 08/18/20 0001      Neck Exercises: Seated   Other Seated Exercise levator scap stretch 3x 20 second holds    Other Seated Exercise scap retraction 2x 10      Neck Exercises: Supine   Neck Retraction 10 reps;5 secs      Manual Therapy   Manual Therapy Soft tissue mobilization;Joint mobilization    Manual therapy comments completed independently from all other aspects of treatment    Joint Mobilization R ribs PA R4-7 at trigger point    Soft tissue mobilization R rhomboids and middle trap; education, demonstration and performance of STM with tennis ball                  PT Education - 08/18/20 1450  Education Details HEP, exercise mechanics    Person(s) Educated Patient    Methods Explanation;Demonstration    Comprehension Verbalized understanding;Returned demonstration            PT Short Term Goals - 08/10/20 0836      PT SHORT TERM GOAL #1   Title Patient will be independent in self management strategies to improve quality of life and functional outcomes.    Time 2    Period Weeks    Status New    Target Date 08/24/20      PT SHORT TERM GOAL #2   Title Patient will be able to demonstrate at least 70 degrees of cervical rotation in sitting position to improve ability to turn head and check back seat while driving    Time 2    Period Weeks    Status New    Target Date 08/24/20      PT SHORT TERM GOAL #3   Title Patient will report at least 50% improvement in overall symptoms and/or function to demonstrate improved functional mobility    Time 2    Period Weeks    Status New    Target Date 08/24/20              PT Long Term Goals - 08/10/20 0837      PT LONG TERM GOAL #1   Title Patient will report no radicular symptoms in right upper extrenmity to improve QOL.    Time 4    Period Weeks    Status New    Target Date 09/07/20      PT LONG TERM GOAL #2   Title Patient will report at least 75% improvement in overall symptoms and/or function to demonstrate improved functional mobility    Time 4    Period Weeks    Status New    Target Date 09/07/20      PT LONG TERM GOAL #3   Title Patient will improve on FOTO score to meet predicted outcomes to demonstrate improved functional mobility.    Time 4    Period Weeks    Status New    Target Date 09/07/20                 Plan - 08/18/20 1450    Clinical Impression Statement Patient tolerates manual therapy well with decrease in symptoms following. Patient with trigger points throughout periscapular region which decrease in tissue tension following manual. Patient shown how to complete self STM with tennis ball and is able to complete with good mechanics. Reviewed HEP and patient shown how to complete cervical retraction correctly in supine. Patient enjoying all new exercises today and states they are helpful to decrease symptoms. Patient feeling improvement at end of session. Patient will continue to benefit from skilled physical therapy in order to reduce impairment and improve function.    Personal Factors and Comorbidities Comorbidity 1    Examination-Activity Limitations Lift;Reach Overhead    Examination-Participation Restrictions Cleaning;Other;Driving   playing and holding grandchildren   Stability/Clinical Decision Making Stable/Uncomplicated    Rehab Potential Good    PT Frequency 2x / week    PT Duration 4 weeks    PT Treatment/Interventions ADLs/Self Care Home Management;Cryotherapy;Electrical Stimulation;Moist Heat;Traction;Therapeutic exercise;Therapeutic activities;Neuromuscular re-education;Patient/family  education;Manual techniques;Taping;Dry needling;Passive range of motion;Spinal Manipulations;Joint Manipulations    PT Next Visit Plan continue thoracic/scapular mobility (thread the needle, protraction), thoracic extension, joint mobs of thoracic spine/traction of cervical spine    PT Home Exercise Plan 2/28  neck ROT supine, chin tucks 3/8 self STM, scap ret, levator scap stretch           Patient will benefit from skilled therapeutic intervention in order to improve the following deficits and impairments:  Pain,Decreased range of motion,Hypomobility,Postural dysfunction,Decreased activity tolerance  Visit Diagnosis: Cervicalgia  Muscle weakness (generalized)  Chronic scapular pain     Problem List Patient Active Problem List   Diagnosis Date Noted  . Cervicalgia 07/22/2020  . Gastroesophageal reflux disease without esophagitis 11/15/2018  . Encounter for screening colonoscopy 11/15/2018  . Unilateral primary osteoarthritis, left knee 04/25/2018  . Muscle weakness (generalized) 01/25/2013  . Pain in joint, shoulder region 01/25/2013  . Arthropathy of shoulder region 01/25/2013    3:33 PM, 08/18/20 Mearl Latin PT, DPT Physical Therapist at [place], [place], [address] Phone: [phone]   Fax:  [phone]  Name: Jaime George MRN: [id number] Date of Birth: [birth year]

## 2020-08-18 NOTE — Patient Instructions (Signed)
Access Code: [facility] URL: [email or web address] Date: 08/18/2020 Prepared by: Mitzi Hansen Reyaansh Merlo  Exercises Web designer - 2 x daily - 7 x weekly - 3 reps - 20 second hold Seated Scapular Retraction - 2 x daily - 7 x weekly - 2 sets - 10 reps

## 2020-08-20 ENCOUNTER — Ambulatory Visit (HOSPITAL_COMMUNITY): Payer: BC Managed Care – PPO | Admitting: Physical Therapy

## 2020-08-24 ENCOUNTER — Encounter (HOSPITAL_COMMUNITY): Payer: Self-pay | Admitting: Physical Therapy

## 2020-08-24 ENCOUNTER — Other Ambulatory Visit: Payer: Self-pay

## 2020-08-24 ENCOUNTER — Ambulatory Visit (HOSPITAL_COMMUNITY): Payer: BC Managed Care – PPO | Admitting: Physical Therapy

## 2020-08-24 DIAGNOSIS — M542 Cervicalgia: Secondary | ICD-10-CM | POA: Diagnosis not present

## 2020-08-24 DIAGNOSIS — M6281 Muscle weakness (generalized): Secondary | ICD-10-CM

## 2020-08-24 DIAGNOSIS — G8929 Other chronic pain: Secondary | ICD-10-CM

## 2020-08-24 NOTE — Therapy (Addendum)
[place], [place], [address] Phone: [phone]   Fax:  [phone]  Physical Therapy Treatment and Discharge Note  Patient Details  Name: Jaime George MRN: [id number] Date of Birth: January 08, 1966 Referring Provider (PT): Phineas Inches, MD   PHYSICAL THERAPY DISCHARGE SUMMARY  Visits from Start of Care: 4  Current functional level related to goals / functional outcomes: Unable to assess due to unplanned discharge   Remaining deficits: Unable to assess due to unplanned discharge   Education / Equipment: Unable to assess due to unplanned discharge  Plan: Patient agrees to discharge.  Patient goals were not met. Patient is being discharged due to not returning since the last visit.  ?????         7:19 AM, 09/03/20 Jerene Pitch, DPT Physical Therapy with [facility]  [phone] office   Encounter Date: 08/24/2020   PT End of Session - 08/24/20 1453    Visit Number 4    Number of Visits 8    Date for PT Re-Evaluation 09/07/20    Authorization Type BCBS  no auth or VL    Progress Note Due on Visit 10    PT Start Time 1454   pt late to session   PT Stop Time 1534    PT Time Calculation (min) 40 min    Activity Tolerance Patient tolerated treatment well    Behavior During Therapy [facility] for tasks assessed/performed           Past Medical History:  Diagnosis Date  . GERD (gastroesophageal reflux disease)     Past Surgical History:  Procedure Laterality Date  . BIOPSY  01/31/2019   Procedure: BIOPSY;  Surgeon: Rogene Houston, MD;  [facility];  Service: Endoscopy;;  gastric   . COLONOSCOPY N/A 01/31/2019   Procedure: COLONOSCOPY;  Surgeon: Rogene Houston, MD;  [facility];  Service: Endoscopy;  Laterality: N/A;  1200  . ESOPHAGOGASTRODUODENOSCOPY N/A 01/31/2019   Procedure: ESOPHAGOGASTRODUODENOSCOPY (EGD);  Surgeon: Rogene Houston, MD;   [facility];  Service: Endoscopy;  Laterality: N/A;  . KNEE SURGERY    . LAPAROSCOPIC APPENDECTOMY  07/18/2011   Procedure: APPENDECTOMY LAPAROSCOPIC;  Surgeon: Donato Heinz, MD;  Location: [facility];  Service: General;  Laterality: N/A;  . POLYPECTOMY  01/31/2019   Procedure: POLYPECTOMY;  Surgeon: Rogene Houston, MD;  [facility];  Service: Endoscopy;;  colon    There were no vitals filed for this visit.   Subjective Assessment - 08/24/20 1538    Subjective States it doesn't hurt like it did, her neck is better. States that she has been doing her exercises and does them every day.    Pertinent History R RCR in 2014    Currently in Pain? No/denies    Pain Onset More than a month ago              [facility] PT Assessment - 08/24/20 0001      Assessment   Medical Diagnosis neck pain    Referring Provider (PT) Phineas Inches, MD                         [facility] Adult PT Treatment/Exercise - 08/24/20 0001      Neck Exercises: Standing   Other Standing Exercises horizontal shoulder abd at wall for cues/posture 3x10 - isometrics at end range add and abd - elbows  bent to 90      Neck Exercises: Prone   Other Prone Exercise quadruped: cat/camel x10 - focus on pressing    Other Prone Exercise deep breathing prone- ribs expansion      Manual Therapy   Manual Therapy Soft tissue mobilization;Joint mobilization    Manual therapy comments completed independently from all other aspects of treatment    Joint Mobilization PA to T 3-6 grade II/III - tolerated well, UPA to ribs 3- 6 bilaterally wtih focus on right side    Soft tissue mobilization to bilateral rhomboids and thoracic paraspinals                  PT Education - 08/24/20 1520    Education Details on anatomy, on rationale for exercise,s mobility work, posture, active vs passive exercises and previous exercises.    Person(s) Educated Patient    Methods Explanation    Comprehension  Verbalized understanding            PT Short Term Goals - 08/10/20 0836      PT SHORT TERM GOAL #1   Title Patient will be independent in self management strategies to improve quality of life and functional outcomes.    Time 2    Period Weeks    Status New    Target Date 08/24/20      PT SHORT TERM GOAL #2   Title Patient will be able to demonstrate at least 70 degrees of cervical rotation in sitting position to improve ability to turn head and check back seat while driving    Time 2    Period Weeks    Status New    Target Date 08/24/20      PT SHORT TERM GOAL #3   Title Patient will report at least 50% improvement in overall symptoms and/or function to demonstrate improved functional mobility    Time 2    Period Weeks    Status New    Target Date 08/24/20             PT Long Term Goals - 08/10/20 0837      PT LONG TERM GOAL #1   Title Patient will report no radicular symptoms in right upper extrenmity to improve QOL.    Time 4    Period Weeks    Status New    Target Date 09/07/20      PT LONG TERM GOAL #2   Title Patient will report at least 75% improvement in overall symptoms and/or function to demonstrate improved functional mobility    Time 4    Period Weeks    Status New    Target Date 09/07/20      PT LONG TERM GOAL #3   Title Patient will improve on FOTO score to meet predicted outcomes to demonstrate improved functional mobility.    Time 4    Period Weeks    Status New    Target Date 09/07/20                 Plan - 08/24/20 1539    Clinical Impression Statement Patient reported relief with exercises after manual work and exercises. Focused on educating patient on posture, differences between active and passive postures and exercises. How mobility work is important but then working in that new ROM is just as important to reduce reoccurrence of issue. Patient reported no pain end of session.    Personal Factors and Comorbidities Comorbidity 1     Examination-Activity  Limitations Lift;Reach Overhead    Examination-Participation Restrictions Cleaning;Other;Driving   playing and holding grandchildren   Stability/Clinical Decision Making Stable/Uncomplicated    Rehab Potential Good    PT Frequency 2x / week    PT Duration 4 weeks    PT Treatment/Interventions ADLs/Self Care Home Management;Cryotherapy;Electrical Stimulation;Moist Heat;Traction;Therapeutic exercise;Therapeutic activities;Neuromuscular re-education;Patient/family education;Manual techniques;Taping;Dry needling;Passive range of motion;Spinal Manipulations;Joint Manipulations    PT Next Visit Plan manual as needed, thoracic/scapular mobility -run through exercises (overhead movements) patient previously performed and work on form    PT Home Exercise Plan 2/28 neck ROT supine, chin tucks 3/8 self STM, scap ret, levator scap stretch; 3/14 cat/cow, horizontal shoulder abd at wall           Patient will benefit from skilled therapeutic intervention in order to improve the following deficits and impairments:  Pain,Decreased range of motion,Hypomobility,Postural dysfunction,Decreased activity tolerance  Visit Diagnosis: Cervicalgia  Muscle weakness (generalized)  Chronic scapular pain     Problem List Patient Active Problem List   Diagnosis Date Noted  . Cervicalgia 07/22/2020  . Gastroesophageal reflux disease without esophagitis 11/15/2018  . Encounter for screening colonoscopy 11/15/2018  . Unilateral primary osteoarthritis, left knee 04/25/2018  . Muscle weakness (generalized) 01/25/2013  . Pain in joint, shoulder region 01/25/2013  . Arthropathy of shoulder region 01/25/2013    3:42 PM, 08/24/20 Jerene Pitch, DPT Physical Therapy with [facility]  [phone] office   [place] [address] [place], [place], [address] Phone: [phone]   Fax:  [phone]  Name: Jaime George MRN: [id number] Date of Birth: 01-10-1966

## 2020-08-26 ENCOUNTER — Ambulatory Visit (HOSPITAL_COMMUNITY): Payer: BC Managed Care – PPO | Admitting: Physical Therapy

## 2020-08-31 ENCOUNTER — Ambulatory Visit (HOSPITAL_COMMUNITY): Payer: BC Managed Care – PPO

## 2020-09-02 ENCOUNTER — Ambulatory Visit (HOSPITAL_COMMUNITY): Payer: BC Managed Care – PPO | Admitting: Physical Therapy

## 2020-09-07 ENCOUNTER — Ambulatory Visit (HOSPITAL_COMMUNITY): Payer: BC Managed Care – PPO | Admitting: Physical Therapy

## 2020-09-09 ENCOUNTER — Encounter (HOSPITAL_COMMUNITY): Payer: BC Managed Care – PPO | Admitting: Physical Therapy

## 2020-10-14 ENCOUNTER — Other Ambulatory Visit: Payer: Self-pay

## 2020-10-14 ENCOUNTER — Ambulatory Visit (INDEPENDENT_AMBULATORY_CARE_PROVIDER_SITE_OTHER): Payer: BC Managed Care – PPO | Admitting: Orthopaedic Surgery

## 2020-10-14 ENCOUNTER — Encounter: Payer: Self-pay | Admitting: Orthopaedic Surgery

## 2020-10-14 VITALS — Ht 67.0 in | Wt 195.0 lb

## 2020-10-14 DIAGNOSIS — M1712 Unilateral primary osteoarthritis, left knee: Secondary | ICD-10-CM

## 2020-10-14 MED ORDER — LIDOCAINE HCL 1 % IJ SOLN
2.0000 mL | INTRAMUSCULAR | Status: AC | PRN
  Administered 2020-10-14: 2 mL

## 2020-10-14 MED ORDER — BUPIVACAINE HCL 0.5 % IJ SOLN
2.0000 mL | INTRAMUSCULAR | Status: AC | PRN
  Administered 2020-10-14: 2 mL via INTRA_ARTICULAR

## 2020-10-14 NOTE — Progress Notes (Signed)
Office Visit Note   Patient: Jaime George           Date of Birth: 1966-01-31           MRN: [id number] Visit Date: 10/14/2020              Requested by: Asencion Noble, MD [address] [place],  [place] [address] PCP: Asencion Noble, MD   Assessment & Plan: Visit Diagnoses:  1. Unilateral primary osteoarthritis, left knee     Plan: Recurrent symptoms of osteoarthritis left knee with predominately medial joint pain.  Prior films demonstrate tricompartmental degenerative changes.  Prefers cortisone injection which was performed without difficulty.  Might want to consider repeat viscosupplementation.  Briefly discussed total knee replacement at some point in future but pending repeat films and possibly even an MRI scan as she is so young  Follow-Up Instructions: Return if symptoms worsen or fail to improve.   Orders:  Orders Placed This Encounter  Procedures  . Large Joint Inj: L knee   No orders of the defined types were placed in this encounter.     Procedures: Large Joint Inj: L knee on 10/14/2020 1:28 PM Indications: pain and diagnostic evaluation Details: 25 G 1.5 in needle, anteromedial approach  Arthrogram: No  Medications: 2 mL lidocaine 1 %; 2 mL bupivacaine 0.5 %  12 mg betamethasone injected in the medial compartment left knee with Xylocaine and Marcaine Procedure, treatment alternatives, risks and benefits explained, specific risks discussed. Consent was given by the patient. Patient was prepped and draped in the usual sterile fashion.       Clinical Data: No additional findings.   Subjective: Chief Complaint  Patient presents with  . Left Knee - Pain  Patient presents today for left knee pain. She is wanting to get a cortisone injection today. She states that she gets good relief from them. She does wear a knee brace as needed. She is having difficulty with flexion.   HPI  Review of Systems   Objective: Vital Signs: Ht 5\' 7"  (1.702 m)   Wt  195 lb (88.5 kg)   BMI 30.54 kg/m   Physical Exam Constitutional:      Appearance: She is well-developed.  Eyes:     Pupils: Pupils are equal, round, and reactive to light.  Pulmonary:     Effort: Pulmonary effort is normal.  Skin:    General: Skin is warm and dry.  Neurological:     Mental Status: She is alert and oriented to person, place, and time.  Psychiatric:        Behavior: Behavior normal.     Ortho Exam left knee was not hot red warm or swollen.  About 100 degrees of flexion and full extension.  No effusion.  Predominately medial joint pain with slight varus on weightbearing.  No instability.  Neurologically intact.  Painless range of motion both hips  Specialty Comments:  No specialty comments available.  Imaging: No results found.   PMFS History: Patient Active Problem List   Diagnosis Date Noted  . Cervicalgia 07/22/2020  . Gastroesophageal reflux disease without esophagitis 11/15/2018  . Encounter for screening colonoscopy 11/15/2018  . Unilateral primary osteoarthritis, left knee 04/25/2018  . Muscle weakness (generalized) 01/25/2013  . Pain in joint, shoulder region 01/25/2013  . Arthropathy of shoulder region 01/25/2013   Past Medical History:  Diagnosis Date  . GERD (gastroesophageal reflux disease)     Family History  Problem Relation Age of Onset  .  Breast cancer Neg Hx     Past Surgical History:  Procedure Laterality Date  . BIOPSY  01/31/2019   Procedure: BIOPSY;  Surgeon: Rogene Houston, MD;  [facility];  Service: Endoscopy;;  gastric   . COLONOSCOPY N/A 01/31/2019   Procedure: COLONOSCOPY;  Surgeon: Rogene Houston, MD;  [facility];  Service: Endoscopy;  Laterality: N/A;  1200  . ESOPHAGOGASTRODUODENOSCOPY N/A 01/31/2019   Procedure: ESOPHAGOGASTRODUODENOSCOPY (EGD);  Surgeon: Rogene Houston, MD;  [facility]: [facility];  Service: Endoscopy;  Laterality: N/A;  . KNEE SURGERY    . LAPAROSCOPIC APPENDECTOMY   07/18/2011   Procedure: APPENDECTOMY LAPAROSCOPIC;  Surgeon: Donato Heinz, MD;  Location: [facility];  Service: General;  Laterality: N/A;  . POLYPECTOMY  01/31/2019   Procedure: POLYPECTOMY;  Surgeon: Rogene Houston, MD;  [facility];  Service: Endoscopy;;  colon   Social History   Occupational History  . Not on file  Tobacco Use  . Smoking status: Never Smoker  . Smokeless tobacco: Never Used  Vaping Use  . Vaping Use: Never used  Substance and Sexual Activity  . Alcohol use: Yes    Comment: occasionally  . Drug use: No  . Sexual activity: Yes    Birth control/protection: None

## 2020-12-24 ENCOUNTER — Other Ambulatory Visit (HOSPITAL_COMMUNITY): Payer: Self-pay | Admitting: Internal Medicine

## 2020-12-24 ENCOUNTER — Ambulatory Visit (HOSPITAL_COMMUNITY)
Admission: RE | Admit: 2020-12-24 | Discharge: 2020-12-24 | Disposition: A | Discharge status: Home / Self Care | Payer: BC Managed Care – PPO | Source: Ambulatory Visit | Attending: Internal Medicine | Admitting: Internal Medicine

## 2020-12-24 ENCOUNTER — Other Ambulatory Visit: Payer: Self-pay

## 2020-12-24 DIAGNOSIS — R059 Cough, unspecified: Secondary | ICD-10-CM | POA: Diagnosis not present

## 2021-01-26 ENCOUNTER — Other Ambulatory Visit: Payer: Self-pay | Admitting: Internal Medicine

## 2021-01-26 DIAGNOSIS — Z1231 Encounter for screening mammogram for malignant neoplasm of breast: Secondary | ICD-10-CM

## 2021-02-25 ENCOUNTER — Other Ambulatory Visit: Payer: Self-pay

## 2021-02-25 ENCOUNTER — Ambulatory Visit
Admission: RE | Admit: 2021-02-25 | Discharge: 2021-02-25 | Disposition: A | Discharge status: Home / Self Care | Payer: BC Managed Care – PPO | Source: Ambulatory Visit | Attending: Internal Medicine | Admitting: Internal Medicine

## 2021-02-25 DIAGNOSIS — Z1231 Encounter for screening mammogram for malignant neoplasm of breast: Secondary | ICD-10-CM

## 2021-05-26 ENCOUNTER — Encounter: Payer: Self-pay | Admitting: Orthopaedic Surgery

## 2021-05-26 ENCOUNTER — Other Ambulatory Visit: Payer: Self-pay

## 2021-05-26 ENCOUNTER — Ambulatory Visit (INDEPENDENT_AMBULATORY_CARE_PROVIDER_SITE_OTHER): Payer: BC Managed Care – PPO | Admitting: Orthopaedic Surgery

## 2021-05-26 DIAGNOSIS — M1712 Unilateral primary osteoarthritis, left knee: Secondary | ICD-10-CM

## 2021-05-26 MED ORDER — METHYLPREDNISOLONE ACETATE 40 MG/ML IJ SUSP
80.0000 mg | INTRAMUSCULAR | Status: AC | PRN
  Administered 2021-05-26: 10:00:00 80 mg via INTRA_ARTICULAR

## 2021-05-26 MED ORDER — BUPIVACAINE HCL 0.25 % IJ SOLN
2.0000 mL | INTRAMUSCULAR | Status: AC | PRN
  Administered 2021-05-26: 10:00:00 2 mL via INTRA_ARTICULAR

## 2021-05-26 MED ORDER — LIDOCAINE HCL 1 % IJ SOLN
2.0000 mL | INTRAMUSCULAR | Status: AC | PRN
  Administered 2021-05-26: 10:00:00 2 mL

## 2021-05-26 NOTE — Progress Notes (Signed)
Office Visit Note   Patient: Jaime George           Date of Birth: Oct 13, 1965           MRN: [id number] Visit Date: 05/26/2021              Requested by: Asencion Noble, MD [address] [place],  [place] [address] PCP: Asencion Noble, MD   Assessment & Plan: Visit Diagnoses:  1. Unilateral primary osteoarthritis, left knee     Plan: Ms. Spink has osteoarthritis in all 3 compartments of her left knee.  Has responded in the past with cortisone and even viscosupplementation.  Her last injection was in May.  She is having recurrent symptoms to the point of compromise and would like to have another cortisone injection and would like to check into repeat viscosupplementation.  I will inject her knee with cortisone today and check on the viscosupplementation.  She is thinking about having knee replacement sometime in the spring if she continues on the present course  Follow-Up Instructions: Return if symptoms worsen or fail to improve.   Orders:  No orders of the defined types were placed in this encounter.  No orders of the defined types were placed in this encounter.     Procedures: Large Joint Inj: L knee on 05/26/2021 9:51 AM Indications: pain and diagnostic evaluation Details: 25 G 1.5 in needle, anteromedial approach  Arthrogram: No  Medications: 2 mL lidocaine 1 %; 80 mg methylPREDNISolone acetate 40 MG/ML; 2 mL bupivacaine 0.25 % Procedure, treatment alternatives, risks and benefits explained, specific risks discussed. Consent was given by the patient. Patient was prepped and draped in the usual sterile fashion.      Clinical Data: No additional findings.   Subjective: Chief Complaint  Patient presents with   Left Knee - Pain  Patient presents today for left knee pain. She is wanting to discuss total knee arthroplasty. She is also wanting to get a cortisone injection today.  HPI  Review of Systems   Objective: Vital Signs: There were no vitals taken for  this visit.  Physical Exam Constitutional:      Appearance: She is well-developed.  Eyes:     Pupils: Pupils are equal, round, and reactive to light.  Pulmonary:     Effort: Pulmonary effort is normal.  Skin:    General: Skin is warm and dry.  Neurological:     Mental Status: She is alert and oriented to person, place, and time.  Psychiatric:        Behavior: Behavior normal.    Ortho Exam: Awake alert and oriented x3.  No acute distress.  Left knee was not hot warm or red.  Mostly medial joint pain and predominantly anteriorly.  Some patella crepitation but no pain with patella compression.  No lateral joint pain.  Full extension of flex at least 100 degrees without instability.  No popliteal pain or mass.  No calf pain  Specialty Comments:  No specialty comments available.  Imaging: No results found.   PMFS History: Patient Active Problem List   Diagnosis Date Noted   Cervicalgia 07/22/2020   Gastroesophageal reflux disease without esophagitis 11/15/2018   Encounter for screening colonoscopy 11/15/2018   Unilateral primary osteoarthritis, left knee 04/25/2018   Muscle weakness (generalized) 01/25/2013   Pain in joint, shoulder region 01/25/2013   Arthropathy of shoulder region 01/25/2013   Past Medical History:  Diagnosis Date   GERD (gastroesophageal reflux disease)  Family History  Problem Relation Age of Onset   Breast cancer Neg Hx     Past Surgical History:  Procedure Laterality Date   BIOPSY  01/31/2019   Procedure: BIOPSY;  Surgeon: Rogene Houston, MD;  [facility];  Service: Endoscopy;;  gastric    COLONOSCOPY N/A 01/31/2019   Procedure: COLONOSCOPY;  Surgeon: Rogene Houston, MD;  [facility];  Service: Endoscopy;  Laterality: N/A;  1200   ESOPHAGOGASTRODUODENOSCOPY N/A 01/31/2019   Procedure: ESOPHAGOGASTRODUODENOSCOPY (EGD);  Surgeon: Rogene Houston, MD;  [facility];  Service: Endoscopy;  Laterality: N/A;    KNEE SURGERY     LAPAROSCOPIC APPENDECTOMY  07/18/2011   Procedure: APPENDECTOMY LAPAROSCOPIC;  Surgeon: Donato Heinz, MD;  Location: [facility];  Service: General;  Laterality: N/A;   POLYPECTOMY  01/31/2019   Procedure: POLYPECTOMY;  Surgeon: Rogene Houston, MD;  [facility];  Service: Endoscopy;;  colon   Social History   Occupational History   Not on file  Tobacco Use   Smoking status: Never   Smokeless tobacco: Never  Vaping Use   Vaping Use: Never used  Substance and Sexual Activity   Alcohol use: Yes    Comment: occasionally   Drug use: No   Sexual activity: Yes    Birth control/protection: None

## 2021-07-09 ENCOUNTER — Emergency Department (HOSPITAL_COMMUNITY)
Admission: EM | Admit: 2021-07-09 | Discharge: 2021-07-10 | Disposition: A | Discharge status: Home / Self Care | Payer: BC Managed Care – PPO | Attending: Student | Admitting: Student

## 2021-07-09 ENCOUNTER — Emergency Department (HOSPITAL_COMMUNITY): Payer: BC Managed Care – PPO

## 2021-07-09 ENCOUNTER — Encounter (HOSPITAL_COMMUNITY): Payer: Self-pay

## 2021-07-09 ENCOUNTER — Other Ambulatory Visit: Payer: Self-pay

## 2021-07-09 ENCOUNTER — Ambulatory Visit
Admission: EM | Admit: 2021-07-09 | Discharge: 2021-07-09 | Payer: BC Managed Care – PPO | Attending: Emergency Medicine | Admitting: Emergency Medicine

## 2021-07-09 DIAGNOSIS — R079 Chest pain, unspecified: Secondary | ICD-10-CM

## 2021-07-09 DIAGNOSIS — R000 Tachycardia, unspecified: Secondary | ICD-10-CM

## 2021-07-09 DIAGNOSIS — B349 Viral infection, unspecified: Secondary | ICD-10-CM

## 2021-07-09 DIAGNOSIS — D72829 Elevated white blood cell count, unspecified: Secondary | ICD-10-CM | POA: Diagnosis not present

## 2021-07-09 DIAGNOSIS — R0602 Shortness of breath: Secondary | ICD-10-CM | POA: Diagnosis not present

## 2021-07-09 DIAGNOSIS — M79605 Pain in left leg: Secondary | ICD-10-CM | POA: Diagnosis not present

## 2021-07-09 DIAGNOSIS — R0789 Other chest pain: Secondary | ICD-10-CM

## 2021-07-09 DIAGNOSIS — Z20822 Contact with and (suspected) exposure to covid-19: Secondary | ICD-10-CM | POA: Diagnosis not present

## 2021-07-09 LAB — RESP PANEL BY RT-PCR (FLU A&B, COVID) ARPGX2
Influenza A by PCR: NEGATIVE
Influenza B by PCR: NEGATIVE
SARS Coronavirus 2 by RT PCR: NEGATIVE

## 2021-07-09 LAB — CBC
HCT: 44.4 % (ref 36.0–46.0)
Hemoglobin: 15 g/dL (ref 12.0–15.0)
MCH: 30.6 pg (ref 26.0–34.0)
MCHC: 33.8 g/dL (ref 30.0–36.0)
MCV: 90.6 fL (ref 80.0–100.0)
Platelets: 239 10*3/uL (ref 150–400)
RBC: 4.9 MIL/uL (ref 3.87–5.11)
RDW: 13.1 % (ref 11.5–15.5)
WBC: 11.2 10*3/uL — ABNORMAL HIGH (ref 4.0–10.5)
nRBC: 0 % (ref 0.0–0.2)

## 2021-07-09 LAB — BASIC METABOLIC PANEL
Anion gap: 10 (ref 5–15)
BUN: 16 mg/dL (ref 6–20)
CO2: 23 mmol/L (ref 22–32)
Calcium: 9.6 mg/dL (ref 8.9–10.3)
Chloride: 100 mmol/L (ref 98–111)
Creatinine, Ser: 0.87 mg/dL (ref 0.44–1.00)
GFR, Estimated: >60 mL/min (ref >60)
Glucose, Bld: 131 mg/dL — ABNORMAL HIGH (ref 70–99)
Potassium: 3.9 mmol/L (ref 3.5–5.1)
Sodium: 133 mmol/L — ABNORMAL LOW (ref 135–145)

## 2021-07-09 LAB — TROPONIN I (HIGH SENSITIVITY)
Troponin I (High Sensitivity): 3 ng/L (ref <18)
Troponin I (High Sensitivity): 3 ng/L (ref <18)

## 2021-07-09 LAB — LIPASE, BLOOD: Lipase: 25 U/L (ref 11–51)

## 2021-07-09 MED ORDER — ASPIRIN 81 MG PO CHEW
324.0000 mg | CHEWABLE_TABLET | Freq: Once | ORAL | Status: AC
  Administered 2021-07-09: 324 mg via ORAL

## 2021-07-09 MED ORDER — ACETAMINOPHEN 325 MG PO TABS
650.0000 mg | ORAL_TABLET | Freq: Once | ORAL | Status: AC
  Administered 2021-07-09: 650 mg via ORAL
  Filled 2021-07-09: qty 2

## 2021-07-09 NOTE — ED Provider Notes (Signed)
[facility] EMERGENCY DEPARTMENT Provider Note   CSN: [id number] Arrival date & time: 07/09/21  1930     History  Chief Complaint  Patient presents with   Chest Pain    Jaime George is a 56 y.o. female with no significant past medical history here for evaluation of chest pain and tachycardia.  Noted symptoms earlier today to substernal area noted to be tachycardic at school.  Felt short of breath.  1 episode of emesis, thought she had some reflux took Tums, Pepcid at home without relief.  Went to urgent care, they sent her here for evaluation.  Patient does admit to some left calf pain however states she had varicose surgery a few months ago and was told she could have pain after this.  No recent injury or trauma.  No history of PE or DVT.  No recent surgery or immobilization.  Has had some chills earlier today noted to be febrile on arrival  HPI     Home Medications Prior to Admission medications   Medication Sig Start Date End Date Taking? Authorizing Provider  ondansetron (ZOFRAN) 4 MG tablet Take 1 tablet (4 mg total) by mouth every 6 (six) hours. 07/10/21  Yes Pollina, Gwenyth Allegra, MD  famotidine (PEPCID) 20 MG tablet Take 1 tablet (20 mg total) by mouth at bedtime as needed for heartburn or indigestion. 01/31/19   Rogene Houston, MD  Multiple Vitamin (MULTIVITAMIN WITH MINERALS) TABS tablet Take 1 tablet by mouth daily.    [provider]  pantoprazole (PROTONIX) 40 MG tablet Take 40 mg by mouth daily.    [provider]  sucralfate (CARAFATE) 1 g tablet Take 1 tablet (1 g total) by mouth 4 (four) times daily -  with meals and at bedtime. 10/01/18   Setzer, Rona Ravens, NP      Allergies    Codeine and Other    Review of Systems   Review of Systems  Constitutional:  Positive for fever.  HENT: Negative.    Respiratory:  Positive for shortness of breath.   Cardiovascular:  Positive for chest pain and palpitations.  Gastrointestinal:  Positive for nausea  and vomiting.  Genitourinary: Negative.   Musculoskeletal: Negative.   Skin: Negative.   Neurological: Negative.   All other systems reviewed and are negative.  Physical Exam Updated Vital Signs BP 134/78 (BP Location: Left Arm) Comment: typo error   Pulse 97    Temp 98.9 F (37.2 C) (Oral)    Resp 18    Ht 5\' 6"  (1.676 m)    Wt 83.9 kg    SpO2 97%    BMI 29.86 kg/m  Physical Exam Vitals and nursing note reviewed.  Constitutional:      General: She is not in acute distress.    Appearance: She is well-developed. She is not ill-appearing, toxic-appearing or diaphoretic.  HENT:     Head: Normocephalic and atraumatic.  Eyes:     Pupils: Pupils are equal, round, and reactive to light.  Cardiovascular:     Rate and Rhythm: Tachycardia present.     Pulses:          Radial pulses are 2+ on the right side and 2+ on the left side.       Dorsalis pedis pulses are 2+ on the right side and 2+ on the left side.     Heart sounds: Normal heart sounds.  Pulmonary:     Effort: Pulmonary effort is normal. No respiratory distress.  Breath sounds: Normal breath sounds.     Comments: Clear bilaterally Chest:     Comments: Non tender Abdominal:     General: Bowel sounds are normal. There is no distension.     Palpations: Abdomen is soft.     Comments: Soft, nontender  Musculoskeletal:        General: Normal range of motion.     Cervical back: Normal range of motion.     Comments: Tenderness to left posterior calf, patient unsure if this is at baseline  Skin:    General: Skin is warm and dry.  Neurological:     General: No focal deficit present.     Mental Status: She is alert.  Psychiatric:        Mood and Affect: Mood normal.    ED Results / Procedures / Treatments   Labs (all labs ordered are listed, but only abnormal results are displayed) Labs Reviewed  BASIC METABOLIC PANEL - Abnormal; Notable for the following components:      Result Value   Sodium 133 (*)    Glucose, Bld  131 (*)    All other components within normal limits  CBC - Abnormal; Notable for the following components:   WBC 11.2 (*)    All other components within normal limits  RESP PANEL BY RT-PCR (FLU A&B, COVID) ARPGX2  LIPASE, BLOOD  TROPONIN I (HIGH SENSITIVITY)  TROPONIN I (HIGH SENSITIVITY)    EKG None  Radiology DG Chest 2 View  Result Date: 07/09/2021 CLINICAL DATA:  Chest pain, tachycardia, fever EXAM: CHEST - 2 VIEW COMPARISON:  12/24/2020 FINDINGS: The heart size and mediastinal contours are within normal limits. Both lungs are clear. The visualized skeletal structures are unremarkable. IMPRESSION: No active cardiopulmonary disease. Electronically Signed   By: Randa Ngo M.D.   On: 07/09/2021 20:12   CT Angio Chest PE W/Cm &/Or Wo Cm  Result Date: 07/10/2021 CLINICAL DATA:  Shortness of breath EXAM: CT ANGIOGRAPHY CHEST WITH CONTRAST TECHNIQUE: Multidetector CT imaging of the chest was performed using the standard protocol during bolus administration of intravenous contrast. Multiplanar CT image reconstructions and MIPs were obtained to evaluate the vascular anatomy. RADIATION DOSE REDUCTION: This exam was performed according to the departmental dose-optimization program which includes automated exposure control, adjustment of the mA and/or kV according to patient size and/or use of iterative reconstruction technique. CONTRAST:  169mL OMNIPAQUE IOHEXOL 350 MG/ML SOLN COMPARISON:  Chest radiograph dated 07/09/2021 FINDINGS: Cardiovascular: Satisfactory opacification of the bilateral pulmonary arteries to the segmental level. No evidence of pulmonary embolism. Although not tailored for evaluation of the thoracic aorta, there is no evidence of thoracic aortic aneurysm or dissection. Heart is normal in size.  No pericardial effusion. Mediastinum/Nodes: No suspicious mediastinal lymphadenopathy. Visualized thyroid is unremarkable. Lungs/Pleura: No suspicious pulmonary nodules. No focal  consolidation. No pleural effusion or pneumothorax. Upper Abdomen: Visualized upper abdomen is grossly unremarkable. Musculoskeletal: Visualized osseous structures are within normal limits. Review of the MIP images confirms the above findings. IMPRESSION: No evidence of pulmonary embolism. Negative CT chest. Electronically Signed   By: Julian Hy M.D.   On: 07/10/2021 02:12    Procedures Procedures    Medications Ordered in ED Medications  acetaminophen (TYLENOL) tablet 650 mg (650 mg Oral Given 07/09/21 2204)  iohexol (OMNIPAQUE) 350 MG/ML injection 100 mL (100 mLs Intravenous Contrast Given 07/10/21 0156)   ED Course/ Medical Decision Making/ A&P    56 year old here for evaluation of palpitations, chest pain.  Noted to  be febrile on arrival, mild tachycardia.  No infectious symptoms.  Does admit to some left calf pain over the last month which she thought was due to her varicose surgery removal.  No history of PE or DVT.  Patient does not appear septic, appears overall well.  She has no current palpitations.  Labs and imaging personally reviewed and interpreted:  EKG without ischemic DG chest without acute abnormality CBC leukocytosis at 15.5 Metabolic panel sodium 208, glucose 131 COVID, Flu Trop 3>>3 Lipase 25  Care transferred to Dr. Betsey Holiday who will Plan to FU on CTA chest and dispo                            Medical Decision Making Amount and/or Complexity of Data Reviewed Labs: ordered. Radiology: ordered.  Risk OTC drugs. Prescription drug management.           Final Clinical Impression(s) / ED Diagnoses Final diagnoses:  Atypical chest pain  Viral illness    Rx / DC Orders ED Discharge Orders          Ordered    ondansetron (ZOFRAN) 4 MG tablet  Every 6 hours        07/10/21 0224              Azula Zappia A, PA-C 07/10/21 1405    Kommor, Debe Coder, MD 07/12/21 [phone]

## 2021-07-09 NOTE — ED Notes (Signed)
Report called to [facility].

## 2021-07-09 NOTE — ED Provider Notes (Signed)
HPI  SUBJECTIVE:  Jaime George is a 56 y.o. female who presents with constant substernal chest pain described as heaviness, pressure, burning associated with palpitations and constant shortness of breath.  She reports 1 episode of emesis today when the chest pain was severe.  She states that her heart rate is normally in the 70s, and noted that it would not go down, even with relaxing.  She denies nausea, diaphoresis, radiation of this pain up her neck, down her arm, through to her back.  She reports bilateral calf pain, worse in the left.  She had vein ablation in both of these legs in August/September, but states that the pain in her left lower extremity has gotten worse over the past month.  No surgery in the past 4 weeks, recent immobilization, hemoptysis, coughing, wheezing, abdominal pain, syncope, dizziness, waterbrash, belching.  She reports lightheadedness but attributes this to poor p.o. intake today.  She states that she is eating and drinking well, no recent diarrhea, intense exercise or other reasons for dehydration.  No recent viral illness.  No recent antibiotics or change in her medication.  She has tried 5 Tums, Protonix, rest and drinking water without improvement in her symptoms.  Symptoms are better with lying down, worse with sitting up.  She has a past medical history of GERD, lower extremity venous reflux status post ablation.  No history of coronary artery disease, hypercholesterolemia, diabetes, hypertension, smoking, PE, DVT, hypothyroidism, CVA, PAD/PVD, prolonged QT, arrhythmia.  Family history significant for MI with dad age 1, grandmother age 64s.   Past Medical History:  Diagnosis Date   GERD (gastroesophageal reflux disease)     Past Surgical History:  Procedure Laterality Date   BIOPSY  01/31/2019   Procedure: BIOPSY;  Surgeon: Rogene Houston, MD;  [facility];  Service: Endoscopy;;  gastric    COLONOSCOPY N/A 01/31/2019   Procedure: COLONOSCOPY;   Surgeon: Rogene Houston, MD;  [facility];  Service: Endoscopy;  Laterality: N/A;  1200   ESOPHAGOGASTRODUODENOSCOPY N/A 01/31/2019   Procedure: ESOPHAGOGASTRODUODENOSCOPY (EGD);  Surgeon: Rogene Houston, MD;  [facility];  Service: Endoscopy;  Laterality: N/A;   KNEE SURGERY     LAPAROSCOPIC APPENDECTOMY  07/18/2011   Procedure: APPENDECTOMY LAPAROSCOPIC;  Surgeon: Donato Heinz, MD;  Location: [facility];  Service: General;  Laterality: N/A;   POLYPECTOMY  01/31/2019   Procedure: POLYPECTOMY;  Surgeon: Rogene Houston, MD;  [facility];  Service: Endoscopy;;  colon    Family History  Problem Relation Age of Onset   Breast cancer Neg Hx     Social History   Tobacco Use   Smoking status: Never   Smokeless tobacco: Never  Vaping Use   Vaping Use: Never used  Substance Use Topics   Alcohol use: Yes    Comment: occasionally   Drug use: No     Current Facility-Administered Medications:    aspirin chewable tablet 324 mg, 324 mg, Oral, Once, Melynda Ripple, MD  Current Outpatient Medications:    famotidine (PEPCID) 20 MG tablet, Take 1 tablet (20 mg total) by mouth at bedtime as needed for heartburn or indigestion., Disp: , Rfl:    Multiple Vitamin (MULTIVITAMIN WITH MINERALS) TABS tablet, Take 1 tablet by mouth daily., Disp: , Rfl:    pantoprazole (PROTONIX) 40 MG tablet, Take 40 mg by mouth daily., Disp: , Rfl:    sucralfate (CARAFATE) 1 g tablet, Take 1 tablet (1 g total)  by mouth 4 (four) times daily -  with meals and at bedtime., Disp: 120 tablet, Rfl: 2  Allergies  Allergen Reactions   Codeine Nausea And Vomiting   Other     Steri strips: blisters     ROS  As noted in HPI.   Physical Exam  BP 126/84 (BP Location: Right Arm)    Pulse (!) 109    Temp 99.6 F (37.6 C) (Oral)    Resp 18    SpO2 95%   Constitutional: Well developed, well nourished, no acute distress Eyes:  EOMI, conjunctiva normal bilaterally HENT:  Normocephalic, atraumatic,mucus membranes moist Respiratory: Normal inspiratory effort, lungs clear bilaterally.  No chest wall tenderness. Cardiovascular: Regular tachycardia, no murmurs, rubs, gallop GI: nondistended skin: No rash, skin intact Musculoskeletal: Soft.  Positive bilateral mild posterior calf tenderness, patient states that this is not new or different.  No lower extremity edema.  Calves grossly symmetric Neurologic: Alert & oriented x 3, no focal neuro deficits Psychiatric: Speech and behavior appropriate   ED Course   Medications  aspirin chewable tablet 324 mg (has no administration in time range)    Orders Placed This Encounter  Procedures   EKG 12-Lead    Standing Status:   Standing    Number of Occurrences:   1   ED EKG    Standing Status:   Standing    Number of Occurrences:   1    Order Specific Question:   Reason for Exam    Answer:   Chest Pain    No results found for this or any previous visit (from the past 24 hour(s)). No results found.  ED Clinical Impression  1. Chest pain, unspecified type   2. Tachycardia   3. Shortness of breath      ED Assessment/Plan  EKG: Sinus tachycardia, rate 114.  Normal axis, prolonged QT.  No hypertrophy.  ST flattening in the inferior and lateral leads, also seen in EKG from 2018.  No ST elevation.  Patient symptomatic while EKG was obtained.  Patiently persistently tachycardic at rest here, with active chest pain and shortness of breath.  Her EKG shows sinus tachycardia no evidence of STEMI.  She is satting well on room air.  She does not have any other risk factors for PE, however, given persistently abnormal vital signs and new prolonged QT, feel that she needs other evaluation to make sure that she is safe to go home.  Patient was given 324 mg of aspirin p.o. here.  Transferring to the ED via EMS.  Gave report to EMS. \ Discussed rationale for transfer to the ED with the patient.  She agrees to go.  Meds  ordered this encounter  Medications   aspirin chewable tablet 324 mg      *This clinic note was created using [employer]. Therefore, there may be occasional mistakes despite careful proofreading.  ?    Melynda Ripple, MD 07/09/21 1910

## 2021-07-09 NOTE — ED Triage Notes (Signed)
Pt BIB via EMS from UC to get a cardiac workup. Per pt, she was having chest tightness and noticed her heart rate was elevated around 120. Pt endorses a fever and vomiting earlier today.

## 2021-07-09 NOTE — ED Notes (Signed)
[facility] here to transport patient to [facility] ED

## 2021-07-09 NOTE — ED Notes (Signed)
Patient is being discharged from the Urgent Care and sent to the Emergency Department via [facility] . Per Dr. Alphonzo Cruise, patient is in need of higher level of care due to Chest pain, tachycardia, SOB. Patient is aware and verbalizes understanding of plan of care.  Vitals:   07/09/21 1818 07/09/21 1850  BP: 126/84 109/76  Pulse: (!) 109 (!) 110  Resp: 18 18  Temp: 99.6 F (37.6 C)   SpO2: 95% 94%

## 2021-07-09 NOTE — ED Triage Notes (Signed)
Patient states she has been experiencing SOB since this morning at 9am  Patient states her heart is at 95   Patient states she has taken her  Pantoprazole and Tums today without any relief

## 2021-07-10 ENCOUNTER — Emergency Department (HOSPITAL_COMMUNITY): Payer: BC Managed Care – PPO

## 2021-07-10 MED ORDER — IOHEXOL 350 MG/ML SOLN
100.0000 mL | Freq: Once | INTRAVENOUS | Status: AC | PRN
  Administered 2021-07-10: 100 mL via INTRAVENOUS

## 2021-07-10 MED ORDER — ONDANSETRON HCL 4 MG PO TABS: 4.0000 mg | ORAL_TABLET | Freq: Four times a day (QID) | ORAL | 0 refills | Status: AC

## 2021-07-10 NOTE — ED Provider Notes (Signed)
Patient signed out to me for follow-up of CT angiography.  Patient seen earlier for chest pain and tachycardia.  EKG without ischemic changes.  Troponins are negative.  She does have a low-grade fever, symptoms likely infectious, possibly viral.  CT PE study does not show any acute pathology, no PE, no pneumonia.   Orpah Greek, MD 07/10/21 [id number]

## 2021-07-10 NOTE — ED Notes (Addendum)
Patient transported to CT 

## 2021-08-04 ENCOUNTER — Encounter: Payer: Self-pay | Admitting: Orthopaedic Surgery

## 2021-08-04 ENCOUNTER — Other Ambulatory Visit: Payer: Self-pay

## 2021-08-04 ENCOUNTER — Ambulatory Visit (INDEPENDENT_AMBULATORY_CARE_PROVIDER_SITE_OTHER): Payer: BC Managed Care – PPO | Admitting: Orthopaedic Surgery

## 2021-08-04 DIAGNOSIS — M1712 Unilateral primary osteoarthritis, left knee: Secondary | ICD-10-CM | POA: Diagnosis not present

## 2021-08-04 NOTE — Progress Notes (Signed)
Office Visit Note   Patient: Jaime George           Date of Birth: 1965/06/27           MRN: [id number] Visit Date: 08/04/2021              Requested by: Asencion Noble, MD [address] [place],  [place] [address] PCP: Asencion Noble, MD   Assessment & Plan: Visit Diagnoses: Left knee arthritis  Plan: Jaime George is a pleasant 56 year old woman with a history of left knee arthritis.  She has done cortisone injections in the past but they do not last very long.  Her last one was 2 months ago.  She did have viscosupplementation in November 2019 and had significant relief.  She did not have to take any anti-inflammatories or Tylenol or use topical medication for quite a while.  She is trying to put off knee replacement as long as possible.  We did apply for another round of viscosupplementation since its been over 3 years and she had such good relief.  Unfortunately this was denied.  She did get the cortisone injection 2 months ago and provided little relief.  She will try and appeal the decision.  We also talked to her about getting into low exercise program.  She has use it brace in the past but it is not helping her currently.  She will continue to use Voltaren gel  Follow-Up Instructions: No follow-ups on file.   Orders:  No orders of the defined types were placed in this encounter.  No orders of the defined types were placed in this encounter.     Procedures: No procedures performed   Clinical Data: No additional findings.   Subjective: Chief Complaint  Patient presents with   Left Knee - Pain  Patient presents today for follow up on her left knee. Her visco request was denied through insurance. She is here to discuss her options.    Review of Systems  All other systems reviewed and are negative.   Objective: Vital Signs: There were no vitals taken for this visit.  Physical Exam Constitutional:      Appearance: Normal appearance.  HENT:     Head: Normocephalic.   Pulmonary:     Effort: Pulmonary effort is normal.  Neurological:     Mental Status: She is alert.    Ortho Exam Left knee no effusion no redness.  She does have grinding with range of motion pain with weightbearing.  Distal strength is intact distal sensation is intact.  Good varus and valgus stability.  Some tenderness over the medial lateral joint lines Specialty Comments:  No specialty comments available.  Imaging: No results found.   PMFS History: Patient Active Problem List   Diagnosis Date Noted   Cervicalgia 07/22/2020   Gastroesophageal reflux disease without esophagitis 11/15/2018   Encounter for screening colonoscopy 11/15/2018   Unilateral primary osteoarthritis, left knee 04/25/2018   Muscle weakness (generalized) 01/25/2013   Pain in joint, shoulder region 01/25/2013   Arthropathy of shoulder region 01/25/2013   Past Medical History:  Diagnosis Date   GERD (gastroesophageal reflux disease)     Family History  Problem Relation Age of Onset   Breast cancer Neg Hx     Past Surgical History:  Procedure Laterality Date   BIOPSY  01/31/2019   Procedure: BIOPSY;  Surgeon: Rogene Houston, MD;  [facility];  Service: Endoscopy;;  gastric    COLONOSCOPY N/A 01/31/2019  Procedure: COLONOSCOPY;  Surgeon: Rogene Houston, MD;  [facility];  Service: Endoscopy;  Laterality: N/A;  1200   ESOPHAGOGASTRODUODENOSCOPY N/A 01/31/2019   Procedure: ESOPHAGOGASTRODUODENOSCOPY (EGD);  Surgeon: Rogene Houston, MD;  [facility];  Service: Endoscopy;  Laterality: N/A;   KNEE SURGERY     LAPAROSCOPIC APPENDECTOMY  07/18/2011   Procedure: APPENDECTOMY LAPAROSCOPIC;  Surgeon: Donato Heinz, MD;  Location: [facility];  Service: General;  Laterality: N/A;   POLYPECTOMY  01/31/2019   Procedure: POLYPECTOMY;  Surgeon: Rogene Houston, MD;  [facility];  Service: Endoscopy;;  colon   VASCULAR SURGERY     Social History    Occupational History   Not on file  Tobacco Use   Smoking status: Never   Smokeless tobacco: Never  Vaping Use   Vaping Use: Never used  Substance and Sexual Activity   Alcohol use: Yes    Comment: occasionally   Drug use: No   Sexual activity: Yes    Birth control/protection: None

## 2021-10-06 ENCOUNTER — Ambulatory Visit (HOSPITAL_BASED_OUTPATIENT_CLINIC_OR_DEPARTMENT_OTHER): Payer: BC Managed Care – PPO | Admitting: Orthopaedic Surgery

## 2021-10-07 ENCOUNTER — Ambulatory Visit (HOSPITAL_BASED_OUTPATIENT_CLINIC_OR_DEPARTMENT_OTHER)
Admission: RE | Admit: 2021-10-07 | Discharge: 2021-10-07 | Disposition: A | Discharge status: Home / Self Care | Payer: BC Managed Care – PPO | Source: Ambulatory Visit | Attending: Orthopaedic Surgery | Admitting: Orthopaedic Surgery

## 2021-10-07 ENCOUNTER — Other Ambulatory Visit (HOSPITAL_BASED_OUTPATIENT_CLINIC_OR_DEPARTMENT_OTHER): Payer: Self-pay | Admitting: Orthopaedic Surgery

## 2021-10-07 ENCOUNTER — Ambulatory Visit (HOSPITAL_BASED_OUTPATIENT_CLINIC_OR_DEPARTMENT_OTHER): Payer: BC Managed Care – PPO | Admitting: Orthopaedic Surgery

## 2021-10-07 DIAGNOSIS — M1712 Unilateral primary osteoarthritis, left knee: Secondary | ICD-10-CM

## 2021-10-07 DIAGNOSIS — M25562 Pain in left knee: Secondary | ICD-10-CM | POA: Diagnosis not present

## 2021-10-07 MED ORDER — TRIAMCINOLONE ACETONIDE 40 MG/ML IJ SUSP
80.0000 mg | INTRAMUSCULAR | Status: AC | PRN
  Administered 2021-10-07: 80 mg via INTRA_ARTICULAR

## 2021-10-07 MED ORDER — LIDOCAINE HCL 1 % IJ SOLN
4.0000 mL | INTRAMUSCULAR | Status: AC | PRN
  Administered 2021-10-07: 4 mL

## 2021-10-07 NOTE — Progress Notes (Signed)
? ?                            ? ? ?Chief Complaint: Knee pain ?  ? ? ?History of Present Illness:  ? ? ?Jaime George is a 56 y.o. female presents with left knee pain which has been ongoing for several years now.  She has been a patient of Dr. Durward Fortes.  He has performed multiple injections as well as viscosupplementation with overall diminished relief.  Her last injection only lasted several months.  She has taken Voltaren gel as well.  She states that the knee is giving out is significantly painful.  She works as a Pharmacist, hospital.  She initially had had a total knee arthroplasty plan but this was subsequently canceled.  She is here today as she is hoping to transition her care as Dr. Durward Fortes is retiring.  She has previously done physical therapy with minimal relief.  She has tried ice as well as heat with minimal relief ? ? ? ?Surgical History:   ?History of previous meniscectomy done with Dr. Durward Fortes ? ?PMH/PSH/Family History/Social History/Meds/Allergies:   ? ?Past Medical History:  ?Diagnosis Date  ?? GERD (gastroesophageal reflux disease)   ? ?Past Surgical History:  ?Procedure Laterality Date  ?? BIOPSY  01/31/2019  ? Procedure: BIOPSY;  Surgeon: Rogene Houston, MD;  [facility];  Service: Endoscopy;;  gastric ?  ?? COLONOSCOPY N/A 01/31/2019  ? Procedure: COLONOSCOPY;  Surgeon: Rogene Houston, MD;  [facility];  Service: Endoscopy;  Laterality: N/A;  1200  ?? ESOPHAGOGASTRODUODENOSCOPY N/A 01/31/2019  ? Procedure: ESOPHAGOGASTRODUODENOSCOPY (EGD);  Surgeon: Rogene Houston, MD;  [facility];  Service: Endoscopy;  Laterality: N/A;  ?? KNEE SURGERY    ?? LAPAROSCOPIC APPENDECTOMY  07/18/2011  ? Procedure: APPENDECTOMY LAPAROSCOPIC;  Surgeon: Donato Heinz, MD;  Location: [facility];  Service: General;  Laterality: N/A;  ?? POLYPECTOMY  01/31/2019  ? Procedure: POLYPECTOMY;  Surgeon: Rogene Houston, MD;  [facility];  Service: Endoscopy;;  colon  ??  VASCULAR SURGERY    ? ?Social History  ? ?Socioeconomic History  ?? Marital status: Married  ?  Spouse name: Not on file  ?? Number of children: Not on file  ?? Years of education: Not on file  ?? Highest education level: Not on file  ?Occupational History  ?? Not on file  ?Tobacco Use  ?? Smoking status: Never  ?? Smokeless tobacco: Never  ?Vaping Use  ?? Vaping Use: Never used  ?Substance and Sexual Activity  ?? Alcohol use: Yes  ?  Comment: occasionally  ?? Drug use: No  ?? Sexual activity: Yes  ?  Birth control/protection: None  ?Other Topics Concern  ?? Not on file  ?Social History Narrative  ?? Not on file  ? ?Social Determinants of Health  ? ?Financial Resource Strain: Not on file  ?Food Insecurity: Not on file  ?Transportation Needs: Not on file  ?Physical Activity: Not on file  ?Stress: Not on file  ?Social Connections: Not on file  ? ?Family History  ?Problem Relation Age of Onset  ?? Breast cancer Neg Hx   ? ?Allergies  ?Allergen Reactions  ?? Codeine Nausea And Vomiting  ?? Other   ?  Steri strips: blisters  ? ?Current Outpatient Medications  ?Medication Sig Dispense Refill  ?? famotidine (PEPCID) 20 MG tablet Take 1 tablet (20 mg total) by mouth  at bedtime as needed for heartburn or indigestion.    ?? Multiple Vitamin (MULTIVITAMIN WITH MINERALS) TABS tablet Take 1 tablet by mouth daily.    ?? ondansetron (ZOFRAN) 4 MG tablet Take 1 tablet (4 mg total) by mouth every 6 (six) hours. 12 tablet 0  ?? pantoprazole (PROTONIX) 40 MG tablet Take 40 mg by mouth daily.    ?? sucralfate (CARAFATE) 1 g tablet Take 1 tablet (1 g total) by mouth 4 (four) times daily -  with meals and at bedtime. 120 tablet 2  ? ?No current facility-administered medications for this visit.  ? ?No results found. ? ?Review of Systems:   ?A ROS was performed including pertinent positives and negatives as documented in the HPI. ? ?Physical Exam :   ?Constitutional: NAD and appears stated age ?Neurological: Alert and oriented ?Psych:  Appropriate affect and cooperative ?There were no vitals taken for this visit.  ? ?Comprehensive Musculoskeletal Exam:   ? ?  ?Musculoskeletal Exam  ?Gait Normal  ?Alignment Normal  ? Right Left  ?Inspection Normal Normal  ?Palpation    ?Tenderness None Medial joint  ?Crepitus None Positive  ?Effusion None Trace  ?Range of Motion    ?Extension 0 0  ?Flexion 135 135  ?Strength    ?Extension 5/5 5/5  ?Flexion 5/5 5/5  ?Ligament Exam     ?Generalized Laxity No No  ?Lachman Negative Negative   ?Pivot Shift Negative Negative  ?Anterior Drawer Negative Negative  ?Valgus at 0 Negative Negative  ?Valgus at 20 Negative Negative  ?Varus at 0 0 0  ?Varus at 20   0 0  ?Posterior Drawer at 90 0 0  ?Vascular/Lymphatic Exam    ?Edema None None  ?Venous Stasis Changes No No  ?Distal Circulation Normal Normal  ?Neurologic    ?Light Touch Sensation Intact Intact  ?Special Tests:   ? ? ? ?Imaging:   ?Xray (4 views left knee): ?Severe osteoarthritis with significant varus deformity ? ? ?I personally reviewed and interpreted the radiographs. ? ? ?Assessment:   ?56 y.o. female with significant left knee osteoarthritis consistent with advanced arthritic changes involving the medial compartment and a varus knee.  At today's visit I described that given the severity of arthritis I do believe that injections would be less likely to provide more than several weeks to several months of relief.  That being said she does not believe that she would be ready for a knee arthroplasty until October of this year.  As result she has requested injection which I would plan to perform today.  I will see her back further should she want an additional injection or to discuss surgical intervention ? ?Plan :   ? ?-Left knee ultrasound-guided injection performed after verbal consent ? ? ? ?Procedure Note ? ?Patient: Jaime George             ?Date of Birth: August 04, 1965           ?MRN: [id number]             ?Visit Date: 10/07/2021 ? ?Procedures: ?Visit  Diagnoses: No diagnosis found. ? ?Large Joint Inj: L knee on 10/07/2021 4:30 PM ?Indications: pain ?Details: 22 G 1.5 in needle, ultrasound-guided anterior approach ? ?Arthrogram: No ? ?Medications: 4 mL lidocaine 1 %; 80 mg triamcinolone acetonide 40 MG/ML ?Outcome: tolerated well, no immediate complications ?Procedure, treatment alternatives, risks and benefits explained, specific risks discussed. Consent was given by the patient. Immediately prior to procedure a time out was  called to verify the correct patient, procedure, equipment, support staff and site/side marked as required. Patient was prepped and draped in the usual sterile fashion.  ? ? ? ? ? ? ? ? ?I personally saw and evaluated the patient, and participated in the management and treatment plan. ? ?Vanetta Mulders, MD ?Attending Physician, Orthopedic Surgery ? ?This document was dictated using Systems analyst. A reasonable attempt at proof reading has been made to minimize errors. ?

## 2021-10-08 ENCOUNTER — Encounter (HOSPITAL_BASED_OUTPATIENT_CLINIC_OR_DEPARTMENT_OTHER): Payer: Self-pay | Admitting: Orthopaedic Surgery

## 2021-10-08 NOTE — Addendum Note (Signed)
Addended by: Raynelle Fanning A on: 10/08/2021 08:00 AM ? ? Modules accepted: Orders ? ?

## 2021-10-19 ENCOUNTER — Ambulatory Visit: Payer: BC Managed Care – PPO | Admitting: Orthopaedic Surgery

## 2021-10-25 ENCOUNTER — Other Ambulatory Visit: Payer: Self-pay

## 2021-10-25 ENCOUNTER — Encounter: Payer: Self-pay | Admitting: Orthopaedic Surgery

## 2021-10-25 ENCOUNTER — Ambulatory Visit: Payer: BC Managed Care – PPO | Admitting: Orthopaedic Surgery

## 2021-10-25 DIAGNOSIS — M25562 Pain in left knee: Secondary | ICD-10-CM

## 2021-10-25 DIAGNOSIS — M1712 Unilateral primary osteoarthritis, left knee: Secondary | ICD-10-CM

## 2021-10-25 DIAGNOSIS — G8929 Other chronic pain: Secondary | ICD-10-CM

## 2021-10-25 NOTE — Progress Notes (Signed)
The patient is sent to me to evaluate and treat left knee osteoarthritis.  She is actually seen Dr. Durward Fortes in the past and has had a successful knee arthroscopy on the right knee.  She did recently see one of my partners Dr. Sammuel Hines who sent her my way to consider knee replacement surgery based on her x-ray findings and clinical exam findings.  She has been having knee pain for some time now with her left knee especially with weightbearing activities.  There is some pain that wakes her up at night.  She denies any symptoms of instability.  Her recent steroid injection did help temporize the pain in the left knee. ? ?On exam there is no significant patellofemoral crepitation of the left knee.  There is varus malalignment that is easily correctable.  Her extension and flexion are excellent.  The knee does not feel ligamentously stable my exam. ? ?I did go over her x-rays with her of her left knee.  These were standing films.  It does show varus malalignment with significant arthritis of the medial compartment of the knee.  There are some mild arthritis of the patellofemoral joint. ? ?I would like to send her to one of my colleagues in town Dr. Carter Kitten with [facility] to consider a partial medial compartment unicompartmental knee replacement.  I showed her a unicompartmental knee replacement model as well as a total knee replacement model.  I do feel it is worth her exploring this as an option.  She agrees with this treatment plan. ?

## 2021-10-26 ENCOUNTER — Telehealth: Payer: Self-pay | Admitting: Orthopaedic Surgery

## 2021-10-26 NOTE — Telephone Encounter (Signed)
Pt called stating Dr Ninfa Linden sent a referral to [facility]. Pt's states they have not received ot. Please resend referral to [facility]. Please call pt when resent to confirmed re faxed. Pt phone number is  ?

## 2021-10-26 NOTE — Telephone Encounter (Signed)
Patient aware it has been sent  ?

## 2022-01-13 ENCOUNTER — Ambulatory Visit (HOSPITAL_BASED_OUTPATIENT_CLINIC_OR_DEPARTMENT_OTHER): Payer: BC Managed Care – PPO | Admitting: Orthopaedic Surgery

## 2022-01-13 DIAGNOSIS — M25562 Pain in left knee: Secondary | ICD-10-CM | POA: Diagnosis not present

## 2022-01-13 DIAGNOSIS — M1712 Unilateral primary osteoarthritis, left knee: Secondary | ICD-10-CM

## 2022-01-13 MED ORDER — LIDOCAINE HCL 1 % IJ SOLN
4.0000 mL | INTRAMUSCULAR | Status: AC | PRN
  Administered 2022-01-13: 4 mL

## 2022-01-13 MED ORDER — TRIAMCINOLONE ACETONIDE 40 MG/ML IJ SUSP
80.0000 mg | INTRAMUSCULAR | Status: AC | PRN
  Administered 2022-01-13: 80 mg via INTRA_ARTICULAR

## 2022-01-13 NOTE — Progress Notes (Signed)
Chief Complaint: Knee pain     History of Present Illness:   01/13/2022: Presents today as she is scheduled with a partial knee replacement with Dr. Mardelle Matte this upcoming November.  She is here today she is hoping to receive an ultrasound-guided left knee injection.  Jaime George is a 57 y.o. female presents with left knee pain which has been ongoing for several years now.  She has been a patient of Dr. Durward Fortes.  He has performed multiple injections as well as viscosupplementation with overall diminished relief.  Her last injection only lasted several months.  She has taken Voltaren gel as well.  She states that the knee is giving out is significantly painful.  She works as a Pharmacist, hospital.  She initially had had a total knee arthroplasty plan but this was subsequently canceled.  She is here today as she is hoping to transition her care as Dr. Durward Fortes is retiring.  She has previously done physical therapy with minimal relief.  She has tried ice as well as heat with minimal relief    Surgical History:   History of previous meniscectomy done with Dr. Durward Fortes  PMH/PSH/Family History/Social History/Meds/Allergies:    Past Medical History:  Diagnosis Date   GERD (gastroesophageal reflux disease)    Past Surgical History:  Procedure Laterality Date   BIOPSY  01/31/2019   Procedure: BIOPSY;  Surgeon: Rogene Houston, MD;  [facility];  Service: Endoscopy;;  gastric    COLONOSCOPY N/A 01/31/2019   Procedure: COLONOSCOPY;  Surgeon: Rogene Houston, MD;  [facility];  Service: Endoscopy;  Laterality: N/A;  1200   ESOPHAGOGASTRODUODENOSCOPY N/A 01/31/2019   Procedure: ESOPHAGOGASTRODUODENOSCOPY (EGD);  Surgeon: Rogene Houston, MD;  [facility];  Service: Endoscopy;  Laterality: N/A;   KNEE SURGERY     LAPAROSCOPIC APPENDECTOMY  07/18/2011   Procedure: APPENDECTOMY LAPAROSCOPIC;  Surgeon: Donato Heinz, MD;  [facility];  Service: General;  Laterality: N/A;   POLYPECTOMY  01/31/2019   Procedure: POLYPECTOMY;  Surgeon: Rogene Houston, MD;  [facility];  Service: Endoscopy;;  colon   VASCULAR SURGERY     Social History   Socioeconomic History   Marital status: Married    Spouse name: Not on file   Number of children: Not on file   Years of education: Not on file   Highest education level: Not on file  Occupational History   Not on file  Tobacco Use   Smoking status: Never   Smokeless tobacco: Never  Vaping Use   Vaping Use: Never used  Substance and Sexual Activity   Alcohol use: Yes    Comment: occasionally   Drug use: No   Sexual activity: Yes    Birth control/protection: None  Other Topics Concern   Not on file  Social History Narrative   Not on file   Social Determinants of Health   Financial Resource Strain: Not on file  Food Insecurity: Not on file  Transportation Needs: Not on file  Physical Activity: Not on file  Stress: Not on file  Social Connections: Not on file   Family History  Problem Relation Age of Onset   Breast cancer Neg Hx    Allergies  Allergen Reactions   Codeine Nausea And Vomiting  Other     Steri strips: blisters   Current Outpatient Medications  Medication Sig Dispense Refill   famotidine (PEPCID) 20 MG tablet Take 1 tablet (20 mg total) by mouth at bedtime as needed for heartburn or indigestion.     Multiple Vitamin (MULTIVITAMIN WITH MINERALS) TABS tablet Take 1 tablet by mouth daily.     ondansetron (ZOFRAN) 4 MG tablet Take 1 tablet (4 mg total) by mouth every 6 (six) hours. 12 tablet 0   pantoprazole (PROTONIX) 40 MG tablet Take 40 mg by mouth daily.     sucralfate (CARAFATE) 1 g tablet Take 1 tablet (1 g total) by mouth 4 (four) times daily -  with meals and at bedtime. 120 tablet 2   No current facility-administered medications for this visit.   No results found.  Review of Systems:   A ROS was performed including  pertinent positives and negatives as documented in the HPI.  Physical Exam :   Constitutional: NAD and appears stated age Neurological: Alert and oriented Psych: Appropriate affect and cooperative There were no vitals taken for this visit.   Comprehensive Musculoskeletal Exam:      Musculoskeletal Exam  Gait Normal  Alignment Normal   Right Left  Inspection Normal Normal  Palpation    Tenderness None Medial joint  Crepitus None Positive  Effusion None Trace  Range of Motion    Extension 0 0  Flexion 135 135  Strength    Extension 5/5 5/5  Flexion 5/5 5/5  Ligament Exam     Generalized Laxity No No  Lachman Negative Negative   Pivot Shift Negative Negative  Anterior Drawer Negative Negative  Valgus at 0 Negative Negative  Valgus at 20 Negative Negative  Varus at 0 0 0  Varus at 20   0 0  Posterior Drawer at 90 0 0  Vascular/Lymphatic Exam    Edema None None  Venous Stasis Changes No No  Distal Circulation Normal Normal  Neurologic    Light Touch Sensation Intact Intact  Special Tests:      Imaging:   Xray (4 views left knee): Severe osteoarthritis with significant varus deformity   I personally reviewed and interpreted the radiographs.   Assessment:   56 y.o. female with significant left knee osteoarthritis consistent with advanced arthritic changes involving the medial compartment and a varus knee.  At today's visit she is requesting a left knee ultrasound-guided injection as a bridge to her procedure today.  I will plan to perform this today and she will see me back on an as-needed basis. Plan :    -Left knee ultrasound-guided injection performed after verbal consent    Procedure Note  Patient: Jaime George             Date of Birth: March 30, 1966           MRN: [id number]             Visit Date: 01/13/2022  Procedures: Visit Diagnoses: No diagnosis found.  Large Joint Inj: L knee on 01/13/2022 8:33 AM Indications: pain Details: 22 G 1.5 in  needle, ultrasound-guided anterior approach  Arthrogram: No  Medications: 4 mL lidocaine 1 %; 80 mg triamcinolone acetonide 40 MG/ML Outcome: tolerated well, no immediate complications Procedure, treatment alternatives, risks and benefits explained, specific risks discussed. Consent was given by the patient. Immediately prior to procedure a time out was called to verify the correct patient, procedure, equipment, support staff and site/side marked as required. Patient was  prepped and draped in the usual sterile fashion.          I personally saw and evaluated the patient, and participated in the management and treatment plan.  Vanetta Mulders, MD Attending Physician, Orthopedic Surgery  This document was dictated using Dragon voice recognition software. A reasonable attempt at proof reading has been made to minimize errors.

## 2022-01-28 ENCOUNTER — Other Ambulatory Visit: Payer: Self-pay | Admitting: Internal Medicine

## 2022-01-28 DIAGNOSIS — Z1231 Encounter for screening mammogram for malignant neoplasm of breast: Secondary | ICD-10-CM

## 2022-02-23 ENCOUNTER — Ambulatory Visit
Admission: RE | Admit: 2022-02-23 | Discharge: 2022-02-23 | Disposition: A | Discharge status: Home / Self Care | Payer: BC Managed Care – PPO | Source: Ambulatory Visit | Attending: Internal Medicine | Admitting: Internal Medicine

## 2022-02-23 DIAGNOSIS — Z1231 Encounter for screening mammogram for malignant neoplasm of breast: Secondary | ICD-10-CM

## 2022-03-19 ENCOUNTER — Other Ambulatory Visit: Payer: Self-pay

## 2022-03-19 ENCOUNTER — Ambulatory Visit
Admission: EM | Admit: 2022-03-19 | Discharge: 2022-03-19 | Disposition: A | Discharge status: Home / Self Care | Payer: BC Managed Care – PPO

## 2022-03-19 ENCOUNTER — Encounter: Payer: Self-pay | Admitting: Emergency Medicine

## 2022-03-19 DIAGNOSIS — H6992 Unspecified Eustachian tube disorder, left ear: Secondary | ICD-10-CM | POA: Diagnosis not present

## 2022-03-19 DIAGNOSIS — J3089 Other allergic rhinitis: Secondary | ICD-10-CM

## 2022-03-19 DIAGNOSIS — H6592 Unspecified nonsuppurative otitis media, left ear: Secondary | ICD-10-CM

## 2022-03-19 MED ORDER — PREDNISONE 50 MG PO TABS
ORAL_TABLET | ORAL | 0 refills | Status: AC
Start: 2022-03-19 — End: ?

## 2022-03-19 NOTE — ED Provider Notes (Signed)
[facility]    CSN: [id number] Arrival date & time: 03/19/22  0934      History   Chief Complaint Chief Complaint  Patient presents with   Otalgia    HPI Jaime George is a 56 y.o. female.   Patient presenting today with 2-week history of ongoing and slightly worsening left ear pain.  Denies significant congestion, fever, headache, decreased hearing, drainage or bleeding from the ear.  Does have a known history of seasonal allergies not currently on anything other than nasal sprays as needed.  Not trying anything over-the-counter for symptoms.    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/25/2013    Past Surgical History:  Procedure Laterality Date   BIOPSY  01/31/2019   Procedure: BIOPSY;  Surgeon: Rogene Houston, MD;  Location: [facility];  Service: Endoscopy;;  gastric    COLONOSCOPY N/A 01/31/2019   Procedure: COLONOSCOPY;  Surgeon: Rogene Houston, MD;  Location: [facility];  Service: Endoscopy;  Laterality: N/A;  1200   ESOPHAGOGASTRODUODENOSCOPY N/A 01/31/2019   Procedure: ESOPHAGOGASTRODUODENOSCOPY (EGD);  Surgeon: Rogene Houston, MD;  Location: [facility];  Service: Endoscopy;  Laterality: N/A;   KNEE SURGERY     LAPAROSCOPIC APPENDECTOMY  07/18/2011   Procedure: APPENDECTOMY LAPAROSCOPIC;  Surgeon: Donato Heinz, MD;  Location: [facility];  Service: General;  Laterality: N/A;   POLYPECTOMY  01/31/2019   Procedure: POLYPECTOMY;  Surgeon: Rogene Houston, MD;  Location: [facility];  Service: Endoscopy;;  colon   VASCULAR  SURGERY      OB History   No obstetric history on file.      Home Medications    Prior to Admission medications   Medication Sig Start Date End Date Taking? Authorizing Provider  predniSONE (DELTASONE) 50 MG tablet Take 1 tab daily with breakfast x 3 days 03/19/22  Yes Volney American, PA-C  SEMAGLUTIDE, 1 MG/DOSE,  Inject into the skin once a week.   Yes [provider]  famotidine (PEPCID) 20 MG tablet Take 1 tablet (20 mg total) by mouth at bedtime as needed for heartburn or indigestion. 01/31/19   Rogene Houston, MD  Multiple Vitamin (MULTIVITAMIN WITH MINERALS) TABS tablet Take 1 tablet by mouth daily.    [provider]  ondansetron (ZOFRAN) 4 MG tablet Take 1 tablet (4 mg total) by mouth every 6 (six) hours. 07/10/21   Orpah Greek, MD  pantoprazole (PROTONIX) 40 MG tablet Take 40 mg by mouth daily.    [provider]  sucralfate (CARAFATE) 1 g tablet Take 1 tablet (1 g total) by mouth 4 (four) times daily -  with meals and at bedtime. 10/01/18   Setzer, Rona Ravens, NP    Family History Family History  Problem Relation Age of Onset   Breast cancer Neg Hx     Social History Social History   Tobacco Use   Smoking status: Never   Smokeless tobacco: Never  Vaping Use   Vaping Use: Never used  Substance Use Topics   Alcohol use: Yes    Comment: occasionally  Drug use: No     Allergies   Chlorhexidine, Codeine, and Other   Review of Systems Review of Systems PER HPI  Physical Exam Triage Vital Signs ED Triage Vitals  Enc Vitals Group     BP 03/19/22 0954 137/85     Pulse Rate 03/19/22 0954 (!) 101     Resp 03/19/22 0954 20     Temp 03/19/22 0954 98.5 F (36.9 C)     Temp Source 03/19/22 0954 Oral     SpO2 03/19/22 0954 98 %     Weight --      Height --      Head Circumference --      Peak Flow --      Pain Score 03/19/22 0955 5     Pain Loc --      Pain Edu? --      Excl. in [place]? --    No data  found.  Updated Vital Signs BP 137/85 (BP Location: Right Arm)   Pulse (!) 101   Temp 98.5 F (36.9 C) (Oral)   Resp 20   SpO2 98%   Visual Acuity Right Eye Distance:   Left Eye Distance:   Bilateral Distance:    Right Eye Near:   Left Eye Near:    Bilateral Near:     Physical Exam Vitals and nursing note reviewed.  Constitutional:      Appearance: Normal appearance. She is not ill-appearing.  HENT:     Head: Atraumatic.     Right Ear: Tympanic membrane and external ear normal.     Left Ear: External ear normal.     Ears:     Comments: Left middle ear effusion present, no erythema, purulence    Nose: Nose normal.     Mouth/Throat:     Mouth: Mucous membranes are moist.  Eyes:     Extraocular Movements: Extraocular movements intact.     Conjunctiva/sclera: Conjunctivae normal.  Cardiovascular:     Rate and Rhythm: Normal rate and regular rhythm.     Heart sounds: Normal heart sounds.  Pulmonary:     Effort: Pulmonary effort is normal.     Breath sounds: Normal breath sounds.  Musculoskeletal:        General: Normal range of motion.     Cervical back: Normal range of motion and neck supple.  Skin:    General: Skin is warm and dry.  Neurological:     Mental Status: She is alert and oriented to person, place, and time.     Motor: No weakness.     Gait: Gait normal.  Psychiatric:        Mood and Affect: Mood normal.        Thought Content: Thought content normal.        Judgment: Judgment normal.      UC Treatments / Results  Labs (all labs ordered are listed, but only abnormal results are displayed) Labs Reviewed - No data to display  EKG   Radiology No results found.  Procedures Procedures (including critical care time)  Medications Ordered in UC Medications - No data to display  Initial Impression / Assessment and Plan / UC Course  I have reviewed the triage vital signs and the nursing notes.  Pertinent labs & imaging results that were  available during my care of the patient were reviewed by me and considered in my medical decision making (see chart for details).     Given duration of symptoms,  will treat with a short course of prednisone, daily antihistamine, twice daily nasal spray and Sudafed as needed.  Follow-up if worsening or not resolving  Final Clinical Impressions(s) / UC Diagnoses   Final diagnoses:  Middle ear effusion, left  Eustachian tube dysfunction, left  Seasonal allergic rhinitis due to other allergic trigger     Discharge Instructions      Antihistamine such as Zyrtec or Xyzal daily in addition to twice daily nasal spray such as Flonase or Rhinocort to keep your seasonal allergies at bay.  You may also take a Sudafed as needed for sinus pressure, ear pressure.  I have sent over a short course of prednisone to help with your ear pain/eustachian tube dysfunction    ED Prescriptions     Medication Sig Dispense Auth. Provider   predniSONE (DELTASONE) 50 MG tablet Take 1 tab daily with breakfast x 3 days 3 tablet Volney American, [place]      PDMP not reviewed this encounter.   Volney American, [place] 03/19/22 1357

## 2022-03-19 NOTE — ED Triage Notes (Signed)
Pt reports left ear pain x2 weeks. Pt reports pcp was out of town and states pain on left side now extending to left side of face, teeth,jaw. Denies any known injury.

## 2022-03-19 NOTE — Discharge Instructions (Addendum)
Antihistamine such as Zyrtec or Xyzal daily in addition to twice daily nasal spray such as Flonase or Rhinocort to keep your seasonal allergies at bay.  You may also take a Sudafed as needed for sinus pressure, ear pressure.  I have sent over a short course of prednisone to help with your ear pain/eustachian tube dysfunction

## 2022-05-30 ENCOUNTER — Other Ambulatory Visit: Payer: Self-pay | Admitting: Internal Medicine

## 2022-05-30 DIAGNOSIS — Z1231 Encounter for screening mammogram for malignant neoplasm of breast: Secondary | ICD-10-CM

## 2022-07-18 IMAGING — DX DG KNEE COMPLETE 4+V*L*
4 series · 4 of 4 positions shown · non-contrast
Comparison: None.

CLINICAL DATA: Left knee pain.

EXAM:
LEFT KNEE - COMPLETE 4 VIEW

[knee ap]
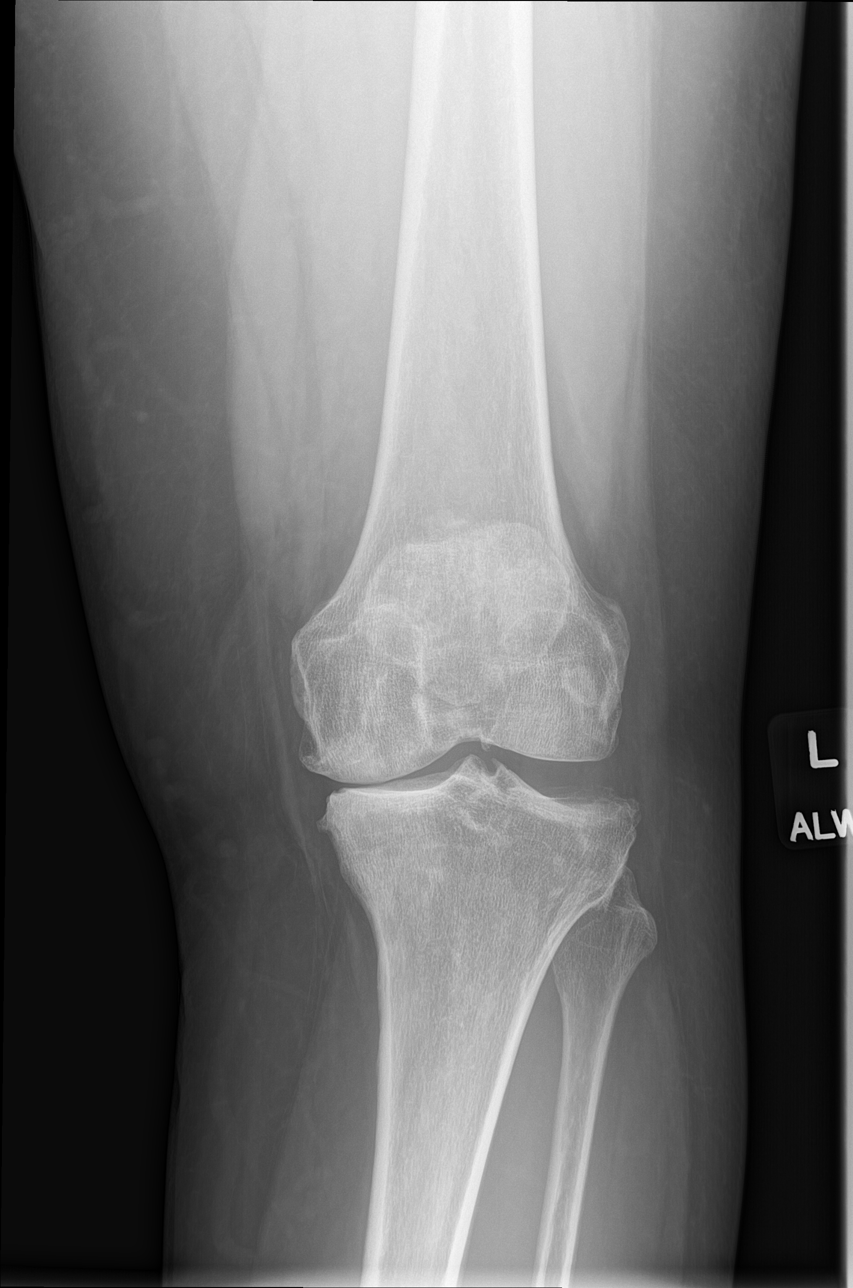

[knee lat]
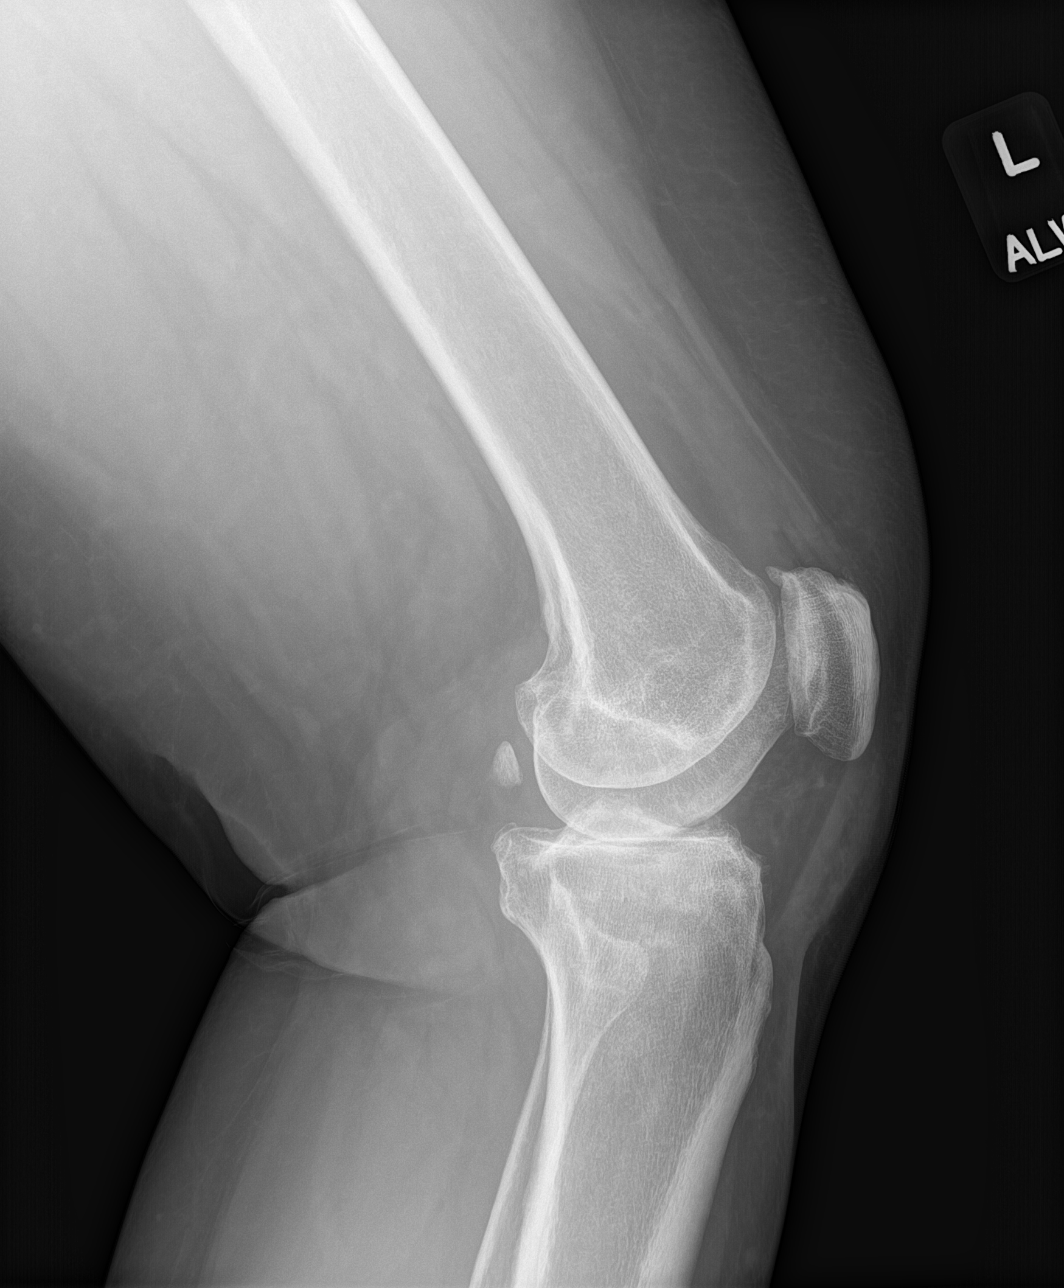

[patella skyline]
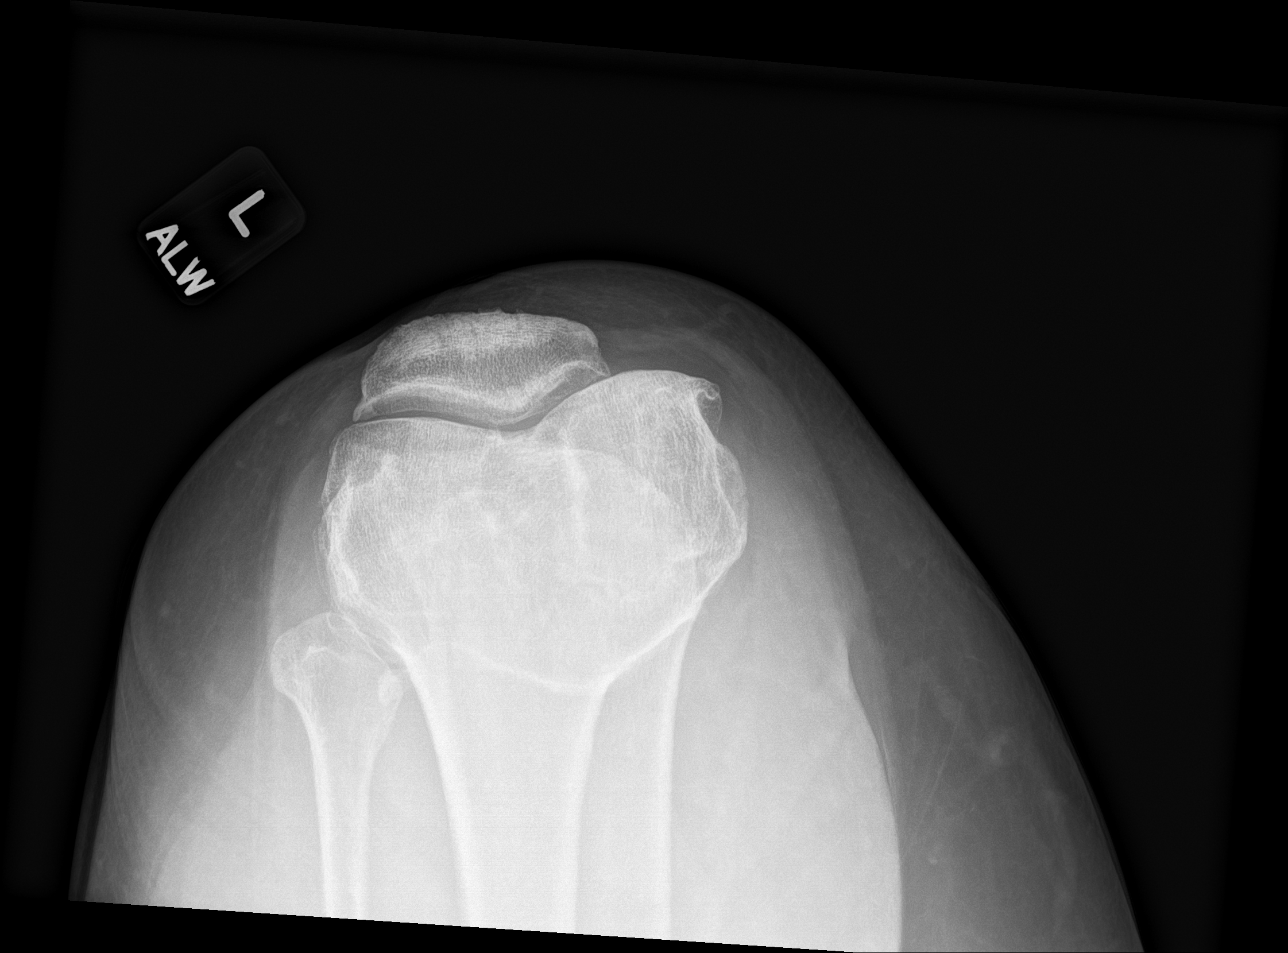

[knee tunnel]
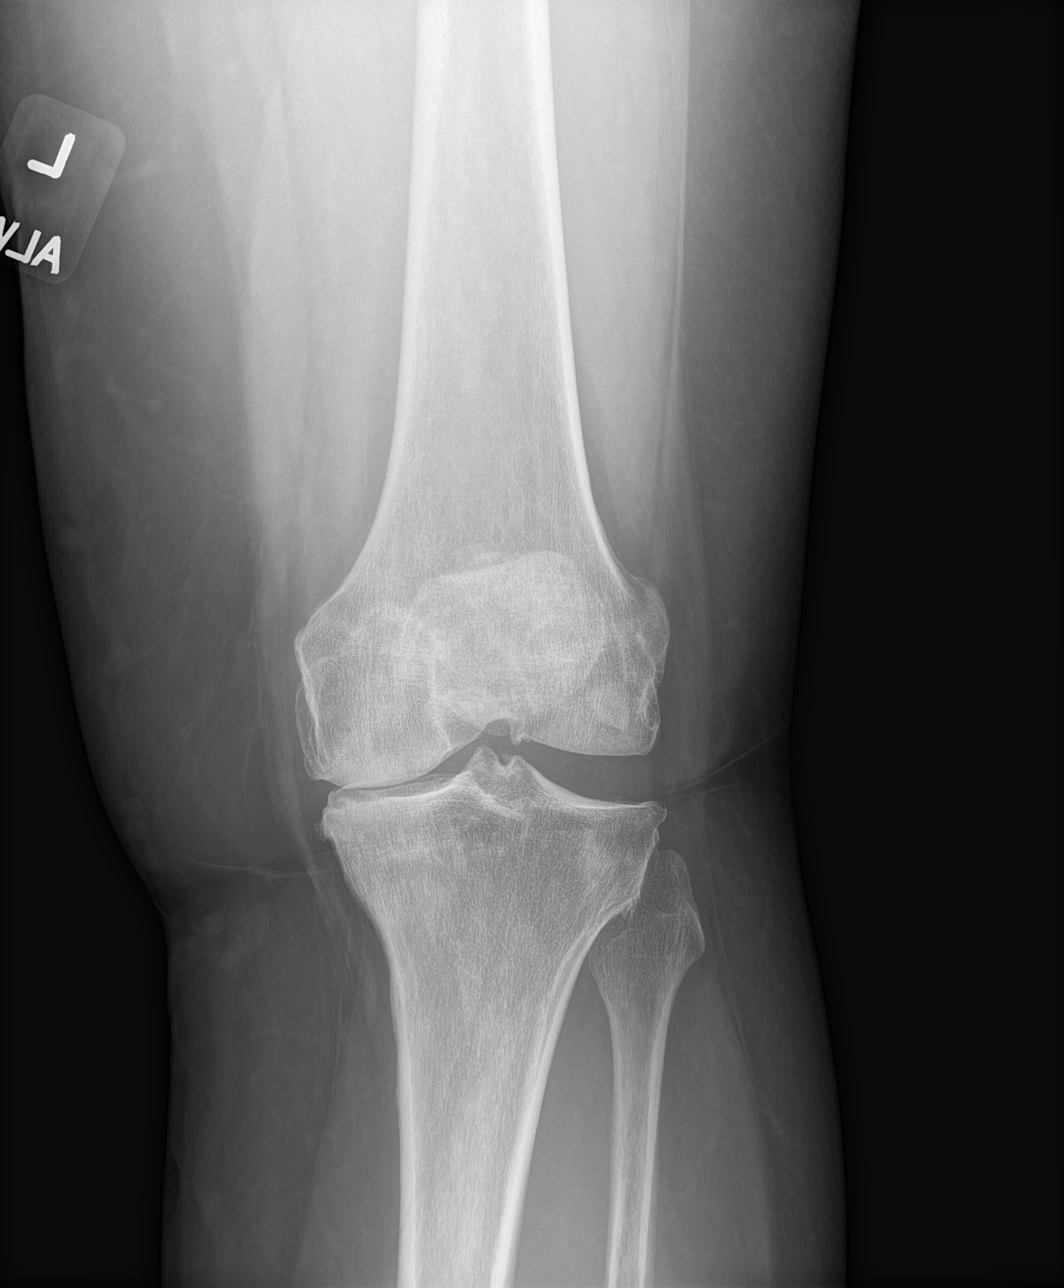

[4 of 4 positions shown; findings below may reference images not displayed]

FINDINGS: No evidence of fracture, dislocation. Small suprapatellar effusion
is noted. Degenerative joint changes with narrow femoral tibial
joint space medially and osteophyte formation are noted. Soft
tissues are unremarkable.
IMPRESSION: Moderate osteoarthritic changes of left knee.

## 2022-07-21 ENCOUNTER — Ambulatory Visit
Admission: RE | Admit: 2022-07-21 | Discharge: 2022-07-21 | Disposition: A | Discharge status: Home / Self Care | Payer: BC Managed Care – PPO | Source: Ambulatory Visit | Attending: Internal Medicine | Admitting: Internal Medicine

## 2022-07-21 DIAGNOSIS — Z1231 Encounter for screening mammogram for malignant neoplasm of breast: Secondary | ICD-10-CM

## 2022-09-09 ENCOUNTER — Other Ambulatory Visit (HOSPITAL_COMMUNITY): Payer: Self-pay | Admitting: Internal Medicine

## 2022-09-09 DIAGNOSIS — E785 Hyperlipidemia, unspecified: Secondary | ICD-10-CM

## 2022-11-08 ENCOUNTER — Ambulatory Visit (HOSPITAL_COMMUNITY)
Admission: RE | Admit: 2022-11-08 | Discharge: 2022-11-08 | Disposition: A | Discharge status: Home / Self Care | Payer: BC Managed Care – PPO | Source: Ambulatory Visit | Attending: Internal Medicine | Admitting: Internal Medicine

## 2022-11-08 DIAGNOSIS — E785 Hyperlipidemia, unspecified: Secondary | ICD-10-CM | POA: Insufficient documentation

## 2023-06-30 ENCOUNTER — Other Ambulatory Visit: Payer: Self-pay | Admitting: Internal Medicine

## 2023-06-30 DIAGNOSIS — Z1231 Encounter for screening mammogram for malignant neoplasm of breast: Secondary | ICD-10-CM

## 2023-07-25 ENCOUNTER — Ambulatory Visit
Admission: RE | Admit: 2023-07-25 | Discharge: 2023-07-25 | Disposition: A | Discharge status: Home / Self Care | Payer: 59 | Source: Ambulatory Visit | Attending: Internal Medicine | Admitting: Internal Medicine

## 2023-07-25 DIAGNOSIS — Z1231 Encounter for screening mammogram for malignant neoplasm of breast: Secondary | ICD-10-CM

## 2024-07-10 ENCOUNTER — Other Ambulatory Visit: Payer: Self-pay | Admitting: Internal Medicine

## 2024-07-10 DIAGNOSIS — Z1231 Encounter for screening mammogram for malignant neoplasm of breast: Secondary | ICD-10-CM

## 2024-07-26 ENCOUNTER — Ambulatory Visit
